# Patient Record
Sex: Female | Born: 1975
Health system: Southern US, Community
[De-identification: ages and names within clinical notes are randomized; demographics above are authoritative.]

## PROBLEM LIST (undated history)

## (undated) DIAGNOSIS — L309 Dermatitis, unspecified: Secondary | ICD-10-CM

## (undated) DIAGNOSIS — I10 Essential (primary) hypertension: Secondary | ICD-10-CM

---

## 2010-08-19 ENCOUNTER — Other Ambulatory Visit: Payer: Self-pay | Admitting: Family Medicine

## 2010-08-19 DIAGNOSIS — R1032 Left lower quadrant pain: Secondary | ICD-10-CM

## 2010-08-20 ENCOUNTER — Other Ambulatory Visit: Payer: Self-pay

## 2010-08-23 ENCOUNTER — Ambulatory Visit
Admission: RE | Admit: 2010-08-23 | Discharge: 2010-08-23 | Disposition: A | Discharge status: Home / Self Care | Payer: Medicaid Other | Source: Ambulatory Visit | Attending: Family Medicine | Admitting: Family Medicine

## 2010-08-23 ENCOUNTER — Ambulatory Visit
Admission: RE | Admit: 2010-08-23 | Discharge: 2010-08-23 | Disposition: A | Discharge status: Home / Self Care | Payer: Self-pay | Source: Ambulatory Visit | Attending: Family Medicine | Admitting: Family Medicine

## 2010-08-23 ENCOUNTER — Other Ambulatory Visit: Payer: Self-pay | Admitting: Family Medicine

## 2010-08-23 DIAGNOSIS — R1032 Left lower quadrant pain: Secondary | ICD-10-CM

## 2010-08-24 ENCOUNTER — Other Ambulatory Visit: Payer: Self-pay

## 2016-03-05 ENCOUNTER — Emergency Department (HOSPITAL_BASED_OUTPATIENT_CLINIC_OR_DEPARTMENT_OTHER)
Admission: EM | Admit: 2016-03-05 | Discharge: 2016-03-05 | Disposition: A | Discharge status: Home / Self Care | Payer: Medicaid Other | Attending: Emergency Medicine | Admitting: Emergency Medicine

## 2016-03-05 ENCOUNTER — Encounter (HOSPITAL_BASED_OUTPATIENT_CLINIC_OR_DEPARTMENT_OTHER): Payer: Self-pay | Admitting: Emergency Medicine

## 2016-03-05 ENCOUNTER — Emergency Department (HOSPITAL_BASED_OUTPATIENT_CLINIC_OR_DEPARTMENT_OTHER): Payer: Medicaid Other

## 2016-03-05 DIAGNOSIS — N83202 Unspecified ovarian cyst, left side: Secondary | ICD-10-CM | POA: Insufficient documentation

## 2016-03-05 DIAGNOSIS — L309 Dermatitis, unspecified: Secondary | ICD-10-CM

## 2016-03-05 DIAGNOSIS — A599 Trichomoniasis, unspecified: Secondary | ICD-10-CM | POA: Insufficient documentation

## 2016-03-05 DIAGNOSIS — R110 Nausea: Secondary | ICD-10-CM | POA: Insufficient documentation

## 2016-03-05 DIAGNOSIS — I10 Essential (primary) hypertension: Secondary | ICD-10-CM | POA: Insufficient documentation

## 2016-03-05 DIAGNOSIS — F17200 Nicotine dependence, unspecified, uncomplicated: Secondary | ICD-10-CM | POA: Insufficient documentation

## 2016-03-05 DIAGNOSIS — R102 Pelvic and perineal pain: Secondary | ICD-10-CM

## 2016-03-05 DIAGNOSIS — R3915 Urgency of urination: Secondary | ICD-10-CM | POA: Insufficient documentation

## 2016-03-05 HISTORY — DX: Essential (primary) hypertension: I10

## 2016-03-05 LAB — URINALYSIS, ROUTINE W REFLEX MICROSCOPIC
Bilirubin Urine: NEGATIVE
GLUCOSE, UA: NEGATIVE mg/dL
Ketones, ur: NEGATIVE mg/dL
Nitrite: NEGATIVE
PH: 7.5 (ref 5.0–8.0)
PROTEIN: NEGATIVE mg/dL
Specific Gravity, Urine: 1.013 (ref 1.005–1.030)

## 2016-03-05 LAB — CBC WITH DIFFERENTIAL/PLATELET
BASOS PCT: 1 %
Basophils Absolute: 0.1 10*3/uL (ref 0.0–0.1)
EOS PCT: 7 %
Eosinophils Absolute: 0.6 10*3/uL (ref 0.0–0.7)
HCT: 38.8 % (ref 36.0–46.0)
Hemoglobin: 13.8 g/dL (ref 12.0–15.0)
Lymphocytes Relative: 28 %
Lymphs Abs: 2.5 10*3/uL (ref 0.7–4.0)
MCH: 30 pg (ref 26.0–34.0)
MCHC: 35.6 g/dL (ref 30.0–36.0)
MCV: 84.3 fL (ref 78.0–100.0)
MONO ABS: 0.7 10*3/uL (ref 0.1–1.0)
MONOS PCT: 7 %
Neutro Abs: 5.2 10*3/uL (ref 1.7–7.7)
Neutrophils Relative %: 57 %
PLATELETS: 293 10*3/uL (ref 150–400)
RBC: 4.6 MIL/uL (ref 3.87–5.11)
RDW: 13.2 % (ref 11.5–15.5)
WBC: 9.1 10*3/uL (ref 4.0–10.5)

## 2016-03-05 LAB — WET PREP, GENITAL
SPERM: NONE SEEN
YEAST WET PREP: NONE SEEN

## 2016-03-05 LAB — URINE MICROSCOPIC-ADD ON

## 2016-03-05 LAB — PREGNANCY, URINE: Preg Test, Ur: NEGATIVE

## 2016-03-05 MED ORDER — TRAMADOL HCL 50 MG PO TABS: 50.0000 mg | ORAL_TABLET | Freq: Four times a day (QID) | ORAL | 0 refills | Status: DC | PRN

## 2016-03-05 MED ORDER — KETOROLAC TROMETHAMINE 60 MG/2ML IM SOLN
60.0000 mg | Freq: Once | INTRAMUSCULAR | Status: AC
  Administered 2016-03-05: 60 mg via INTRAMUSCULAR
  Filled 2016-03-05: qty 2

## 2016-03-05 MED ORDER — METRONIDAZOLE 500 MG PO TABS: 500.0000 mg | ORAL_TABLET | Freq: Two times a day (BID) | ORAL | 0 refills | Status: DC

## 2016-03-05 MED ORDER — TRIAMCINOLONE ACETONIDE 0.1 % EX CREA: 1.0000 "application " | TOPICAL_CREAM | Freq: Two times a day (BID) | CUTANEOUS | 0 refills | Status: DC

## 2016-03-05 MED ORDER — NAPROXEN 500 MG PO TABS: 500.0000 mg | ORAL_TABLET | Freq: Two times a day (BID) | ORAL | 0 refills | Status: DC

## 2016-03-05 NOTE — ED Triage Notes (Signed)
Patient states that she has had a left pelvic pressure and groing pain x 1 week. The patient also reports that she is getting a generalized rash

## 2016-03-05 NOTE — ED Provider Notes (Signed)
[facility] Provider Note   CSN: [id number] Arrival date & time: 03/05/16  1549  By signing my name below, I, Ephriam Jenkins, attest that this documentation has been prepared under the direction and in the presence of [facility] NP.  Electronically Signed: Ephriam Jenkins, ED Scribe. 03/05/16. 4:43 PM.   History   Chief Complaint Chief Complaint  Patient presents with  . Pelvic Pain    HPI HPI Comments: Christina Mayer is a 40 y.o. female who presents to the Emergency Department complaining of 8/10 sharp pelvic pain that started six days ago. Pt reports worst pain to the left side of her lower abdomen. Pt has taken aleve with no significant relief.  Pt does not take birth control and states she is currently sexually active. Pt reports increased urinary urgency and frequency since the onset of symptoms. Denies dysuria. Pt further reports mild nausea. Pt's last pap smear February 2017 at health department in [facility]; states that her results were normal. Pt is G2P1A0; one full term stillborn. Pt also complains of dry "rashes" to her bilateral forearms and legs. Pt has applied hydrocortisone with no relief. Pt denies any Hx of abdominal surgeries, ovarian cysts, PID. She denies fever, chills, back pain, constipation, diarrhea, cold symptoms.    The history is provided by the patient. No language interpreter was used.   Past Medical History:  Diagnosis Date  . Hypertension     There are no active problems to display for this patient.   History reviewed. No pertinent surgical history.  OB History    No data available       Home Medications    Prior to Admission medications   Medication Sig Start Date End Date Taking? Authorizing Provider  metroNIDAZOLE (FLAGYL) 500 MG tablet Take 1 tablet (500 mg total) by mouth 2 (two) times daily. 03/05/16   Deron Poole Bunnie Pion, NP  naproxen (NAPROSYN) 500 MG tablet Take 1 tablet (500 mg total) by mouth 2 (two) times daily. 03/05/16   Jehad Bisono Bunnie Pion, NP  traMADol (ULTRAM) 50 MG tablet Take 1 tablet (50 mg total) by mouth every 6 (six) hours as needed. 03/05/16   Scotland Korver Bunnie Pion, NP    Family History History reviewed. No pertinent family history.  Social History Social History  Substance Use Topics  . Smoking status: Current Some Day Smoker  . Smokeless tobacco: Never Used  . Alcohol use Yes     Comment: occ     Allergies   Review of patient's allergies indicates no known allergies.   Review of Systems Review of Systems  Constitutional: Negative for chills and fever.  Gastrointestinal: Positive for abdominal pain (lower) and nausea. Negative for constipation and diarrhea.  Genitourinary: Positive for frequency and urgency.  Musculoskeletal: Negative for back pain.  All other systems reviewed and are negative.    Physical Exam Updated Vital Signs BP 160/91 (BP Location: Right Arm)   Pulse (!) 58   Temp 98.3 F (36.8 C) (Oral)   Resp 12   Ht 5\' 6"  (1.676 m)   Wt 85.2 kg   LMP 02/16/2016 (Approximate)   SpO2 100%   BMI 30.33 kg/m   Physical Exam  Constitutional: She is oriented to person, place, and time. She appears well-developed and well-nourished. No distress.     HENT:  Head: Normocephalic and atraumatic.  Neck: Normal range of motion.  Cardiovascular: Normal rate.   Pulmonary/Chest: Effort normal and breath sounds normal.  Abdominal: Soft. Bowel sounds  are normal. There is tenderness.  LLQ and Suprapubic tenderness on palpation  Genitourinary:  Genitourinary Comments: External genitalia without lesions, scant blood vaginal vault, no CMT, left adnexal tenderness, uterus without palpable enlargement.   Musculoskeletal: She exhibits no tenderness.  No CVA tenderness  Neurological: She is alert and oriented to person, place, and time.  Skin: Skin is warm and dry. She is not diaphoretic.   Large dry patches of skin to the forearms. And small area to the posterior aspects of the thighs   Psychiatric:  She has a normal mood and affect. Her behavior is normal.  Nursing note and vitals reviewed.    ED Treatments / Results  DIAGNOSTIC STUDIES: Oxygen Saturation is 100% on RA, normal by my interpretation.  COORDINATION OF CARE: 4:25 PM-Will do pelvic exam. Discussed treatment plan with pt at bedside and pt agreed to plan.   Labs (all labs ordered are listed, but only abnormal results are displayed) Labs Reviewed  WET PREP, GENITAL - Abnormal; Notable for the following:       Result Value   Trich, Wet Prep PRESENT (*)    Clue Cells Wet Prep HPF POC PRESENT (*)    WBC, Wet Prep HPF POC MODERATE (*)    All other components within normal limits  URINALYSIS, ROUTINE W REFLEX MICROSCOPIC (NOT AT [facility]) - Abnormal; Notable for the following:    Hgb urine dipstick TRACE (*)    Leukocytes, UA SMALL (*)    All other components within normal limits  URINE MICROSCOPIC-ADD ON - Abnormal; Notable for the following:    Squamous Epithelial / LPF 0-5 (*)    Bacteria, UA FEW (*)    All other components within normal limits  PREGNANCY, URINE  CBC WITH DIFFERENTIAL/PLATELET  RPR  HIV ANTIBODY (ROUTINE TESTING)  GC/CHLAMYDIA PROBE AMP ([place]) NOT AT [facility]    Radiology US Transvaginal Non-ob  Result Date: 03/05/2016 CLINICAL DATA:  Left lower quadrant pelvic pain for 5 days EXAM: TRANSABDOMINAL AND TRANSVAGINAL ULTRASOUND OF PELVIS TECHNIQUE: Both transabdominal and transvaginal ultrasound examinations of the pelvis were performed. Transabdominal technique was performed for global imaging of the pelvis including uterus, ovaries, adnexal regions, and pelvic cul-de-sac. It was necessary to proceed with endovaginal exam following the transabdominal exam to visualize the pelvic structures. COMPARISON:  None FINDINGS: Uterus Measurements: 9.1 x 4.7 x 5.2 cm. Diffuse heterogeneity is noted which may be related to leiomyomatous involvement although no definitive fibroid is seen. Endometrium Thickness:  8.5 mm. Tiny amount of fluid is noted endometrial cavity of uncertain significance. Right ovary Measurements: 3.2 x 1.9 x 1.7 cm. Normal appearance/no adnexal mass. Left ovary Measurements: 4.4 x 2.4 x 2.9 cm. Complex relatively hyperechoic areas noted within the left ovary measuring 2.5 x 1.6 x 2.2 cm. This may represent a hemorrhagic cyst. Other findings Mild free fluid is noted. IMPRESSION: Complex area in the left ovary as described which may represent a hemorrhagic cyst. Followup in 6-8 weeks is recommended as clinically indicated. Electronically Signed   By: Inez Catalina M.D.   On: 03/05/2016 17:45   US Pelvis Complete  Result Date: 03/05/2016 CLINICAL DATA:  Left lower quadrant pelvic pain for 5 days EXAM: TRANSABDOMINAL AND TRANSVAGINAL ULTRASOUND OF PELVIS TECHNIQUE: Both transabdominal and transvaginal ultrasound examinations of the pelvis were performed. Transabdominal technique was performed for global imaging of the pelvis including uterus, ovaries, adnexal regions, and pelvic cul-de-sac. It was necessary to proceed with endovaginal exam following the transabdominal exam to visualize the  pelvic structures. COMPARISON:  None FINDINGS: Uterus Measurements: 9.1 x 4.7 x 5.2 cm. Diffuse heterogeneity is noted which may be related to leiomyomatous involvement although no definitive fibroid is seen. Endometrium Thickness: 8.5 mm. Tiny amount of fluid is noted endometrial cavity of uncertain significance. Right ovary Measurements: 3.2 x 1.9 x 1.7 cm. Normal appearance/no adnexal mass. Left ovary Measurements: 4.4 x 2.4 x 2.9 cm. Complex relatively hyperechoic areas noted within the left ovary measuring 2.5 x 1.6 x 2.2 cm. This may represent a hemorrhagic cyst. Other findings Mild free fluid is noted. IMPRESSION: Complex area in the left ovary as described which may represent a hemorrhagic cyst. Followup in 6-8 weeks is recommended as clinically indicated. Electronically Signed   By: Inez Catalina M.D.   On:  03/05/2016 17:45    Procedures Procedures (including critical care time)  Medications Ordered in ED Medications  ketorolac (TORADOL) injection 60 mg (60 mg Intramuscular Given 03/05/16 1856)     Initial Impression / Assessment and Plan / ED Course  I have reviewed the triage vital signs and the nursing notes.  Pertinent labs & imaging results that were available during my care of the patient were reviewed by me and considered in my medical decision making (see chart for details).  Clinical Course   Toradol IM for pain  Ultrasound, labs  Final Clinical Impressions(s) / ED Diagnoses  40 y.o. female with LLQ abdominal pain stable for d/c with pain improved after Toradol. Will treat for trichomonas and patient to notify her sex partners.  She will f/u with the health department.   Final diagnoses:  Pelvic pain in female  Left ovarian cyst  Trichomonas infection    New Prescriptions New Prescriptions   METRONIDAZOLE (FLAGYL) 500 MG TABLET    Take 1 tablet (500 mg total) by mouth 2 (two) times daily.   NAPROXEN (NAPROSYN) 500 MG TABLET    Take 1 tablet (500 mg total) by mouth 2 (two) times daily.   TRAMADOL (ULTRAM) 50 MG TABLET    Take 1 tablet (50 mg total) by mouth every 6 (six) hours as needed.  I personally performed the services described in this documentation, which was scribed in my presence. The recorded information has been reviewed and is accurate.     [address] [place], NP 03/05/16 Dorthula Perfect    Leonard Schwartz, MD 03/06/16 [phone]

## 2016-03-05 NOTE — Discharge Instructions (Signed)
Follow up with your GYN to have a follow up ultrasound in 6 to 8 weeks to be sure the cyst has resolved. Return sooner for any problems.  Be sure your sex partner is treated for trichomonas infection since men often don't have symptoms and can re infect you.

## 2016-03-07 LAB — HIV ANTIBODY (ROUTINE TESTING W REFLEX): HIV Screen 4th Generation wRfx: NONREACTIVE

## 2016-03-07 LAB — GC/CHLAMYDIA PROBE AMP (~~LOC~~) NOT AT ARMC
Chlamydia: NEGATIVE
Neisseria Gonorrhea: NEGATIVE

## 2016-03-07 LAB — RPR: RPR: NONREACTIVE

## 2016-10-27 ENCOUNTER — Emergency Department (HOSPITAL_BASED_OUTPATIENT_CLINIC_OR_DEPARTMENT_OTHER)
Admission: EM | Admit: 2016-10-27 | Discharge: 2016-10-27 | Disposition: A | Discharge status: Home / Self Care | Payer: Medicaid Other | Attending: Emergency Medicine | Admitting: Emergency Medicine

## 2016-10-27 ENCOUNTER — Encounter (HOSPITAL_BASED_OUTPATIENT_CLINIC_OR_DEPARTMENT_OTHER): Payer: Self-pay

## 2016-10-27 DIAGNOSIS — I10 Essential (primary) hypertension: Secondary | ICD-10-CM | POA: Insufficient documentation

## 2016-10-27 DIAGNOSIS — F17200 Nicotine dependence, unspecified, uncomplicated: Secondary | ICD-10-CM | POA: Insufficient documentation

## 2016-10-27 DIAGNOSIS — L258 Unspecified contact dermatitis due to other agents: Secondary | ICD-10-CM | POA: Insufficient documentation

## 2016-10-27 HISTORY — DX: Dermatitis, unspecified: L30.9

## 2016-10-27 MED ORDER — PREDNISONE 10 MG PO TABS: 40.0000 mg | ORAL_TABLET | Freq: Every day | ORAL | 0 refills | Status: AC

## 2016-10-27 MED ORDER — TRIAMCINOLONE ACETONIDE 0.1 % EX CREA: 1.0000 "application " | TOPICAL_CREAM | Freq: Two times a day (BID) | CUTANEOUS | 0 refills | Status: DC

## 2016-10-27 MED ORDER — PREDNISONE 50 MG PO TABS: 60.0000 mg | ORAL_TABLET | Freq: Once | ORAL | Status: DC

## 2016-10-27 MED ORDER — DIPHENHYDRAMINE HCL 25 MG PO CAPS: 50.0000 mg | ORAL_CAPSULE | Freq: Once | ORAL | Status: DC

## 2016-10-27 MED ORDER — PREDNISONE 50 MG PO TABS
60.0000 mg | ORAL_TABLET | Freq: Once | ORAL | Status: AC
  Administered 2016-10-27: 60 mg via ORAL
  Filled 2016-10-27: qty 1

## 2016-10-27 MED FILL — predniSONE 10 MG TABS: 10 | 4 days supply | Qty: 16 | Fill #0

## 2016-10-27 MED FILL — TRIAMCINOLONE 0.1% CREAM: 0.1 | 7 days supply | Qty: 15 | Fill #0

## 2016-10-27 NOTE — ED Notes (Signed)
Pt directed to pharmacy to pick up Rx. Work note given

## 2016-10-27 NOTE — ED Triage Notes (Signed)
c/o rash x 4 days-NAD-steady gait

## 2016-10-27 NOTE — Discharge Instructions (Signed)
Keep your skin covered while at work. Start taking the prednisone tomorrow for the next 4 days. Benadryl at nighttime. Follow up with your primary care provider. Return to the emergency department if your rash worsens, you have a fever, chills, or any other new concerning symptoms.

## 2016-10-27 NOTE — ED Provider Notes (Signed)
[place] [facility] Provider Note   CSN: [id number] Arrival date & time: 10/27/16  1129     History   Chief Complaint Chief Complaint  Patient presents with  . Rash    HPI Christina Mayer is a 41 y.o. female presenting with 4 days of a papular rash that started on the dorsum of her hands and is now spreading up her arms, neck and along her hairline. She reports pruritus on her hands but burning on her neck and hairline. This is different from her eczema which is on the posterior elbows. She denies any rash on her ankles or history of outdoor activities. 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 She denies headache, fever, chills, nausea, vomiting, or any other symptoms.  HPI  Past Medical History:  Diagnosis Date  . Eczema   . Hypertension     There are no active problems to display for this patient.   History reviewed. No pertinent surgical history.  OB History    No data available       Home Medications    Prior to Admission medications   Medication Sig Start Date End Date Taking? Authorizing Provider  predniSONE (DELTASONE) 10 MG tablet Take 4 tablets (40 mg total) by mouth daily. 10/27/16 10/31/16  Emeline General, PA-C  triamcinolone cream (KENALOG) 0.1 % Apply 1 application topically 2 (two) times daily. 10/27/16   Emeline General, PA-C    Family History No family history on file.  Social History Social History  Substance Use Topics  . Smoking status: Current Every Day Smoker  . Smokeless tobacco: Never Used  . Alcohol use Yes     Comment: occ     Allergies   Patient has no known allergies.   Review of Systems Review of Systems  Constitutional: Negative for chills and fever.  HENT: Negative for ear pain, facial swelling, mouth sores and sore throat.   Respiratory: Negative for cough, shortness of breath, wheezing and stridor.     Cardiovascular: Negative for chest pain, palpitations and leg swelling.  Gastrointestinal: Negative for abdominal distention, abdominal pain, diarrhea, nausea and vomiting.  Musculoskeletal: Negative for arthralgias, back pain, myalgias, neck pain and neck stiffness.  Skin: Positive for color change and rash. Negative for pallor and wound.  Neurological: Negative for dizziness, seizures, syncope, weakness and headaches.     Physical Exam Updated Vital Signs BP 137/74 (BP Location: Left Arm)   Pulse (!) 58   Temp 98.5 F (36.9 C) (Oral)   Resp 18   Ht 5' 6.5" (1.689 m)   Wt 81.6 kg   LMP 10/26/2016   SpO2 100%   BMI 28.62 kg/m   Physical Exam  Constitutional: She appears well-developed and well-nourished. No distress.  Patient is afebrile, nontoxic-appearing, sitting comfortably in chair in no acute distress.  HENT:  Head: Normocephalic and atraumatic.  Eyes: Conjunctivae and EOM are normal. Right eye exhibits no discharge. Left eye exhibits no discharge.  Neck: Normal range of motion. Neck supple.  Cardiovascular: Normal rate, regular rhythm and normal heart sounds.   No murmur heard. Pulmonary/Chest: Effort normal and breath sounds normal. No stridor. No respiratory distress. She has no wheezes. She has no rales. She exhibits no tenderness.  Abdominal: She exhibits no distension. There is no tenderness.  Musculoskeletal: She exhibits no edema or deformity.  Lymphadenopathy:  She has no cervical adenopathy.  Neurological: She is alert.  Skin: Skin is warm and dry. Rash noted. She is not diaphoretic. There is erythema. No pallor.  Psychiatric: She has a normal mood and affect.  Nursing note and vitals reviewed.    ED Treatments / Results  Labs (all labs ordered are listed, but only abnormal results are displayed) Labs Reviewed - No data to display  EKG  EKG Interpretation None       Radiology No results found.  Procedures Procedures (including critical  care time)  Medications Ordered in ED Medications  predniSONE (DELTASONE) tablet 60 mg (60 mg Oral Given 10/27/16 1457)     Initial Impression / Assessment and Plan / ED Course  I have reviewed the triage vital signs and the nursing notes.  Pertinent labs & imaging results that were available during my care of the patient were reviewed by me and considered in my medical decision making (see chart for details).     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 She does have a history of eczema but this is a little bit different.   Presentation and skin findings consistent with contact dermatitis. Patient was given prednisone and offered Benadryl while in the ED. She refused Benadryl because she has to drive home, but she will be taking it when she gets home.  She was stable, nontoxic and in no distress. Exam otherwise unremarkable, lungs are clear and equal bilaterally, no difficulty breathing or swelling of the throat. Tolerating oral secretions well. Discharge home with steroid burst/cream and Benadryl. Advised patient to follow up with primary care provider.  Discussed strict return precautions and advised to return to the emergency department if experiencing any new or worsening symptoms. Instructions were understood and patient agreed with discharge plan.  Final Clinical Impressions(s) / ED Diagnoses   Final diagnoses:  Contact dermatitis due to other agent, unspecified contact dermatitis type    New Prescriptions Discharge Medication List as of 10/27/2016  2:29 PM    START taking these medications   Details  predniSONE (DELTASONE) 10 MG tablet Take 4 tablets (40 mg total) by mouth daily., Starting Thu 10/27/2016, Until Mon 10/31/2016, Print    triamcinolone cream (KENALOG) 0.1 % Apply 1 application topically 2 (two) times daily., Starting Thu 10/27/2016, Print         Emeline General,  PA-C 10/27/16 [place], MD 10/28/16 [phone]

## 2016-12-08 ENCOUNTER — Emergency Department (HOSPITAL_BASED_OUTPATIENT_CLINIC_OR_DEPARTMENT_OTHER)
Admission: EM | Admit: 2016-12-08 | Discharge: 2016-12-08 | Disposition: A | Discharge status: Home / Self Care | Payer: Medicaid Other | Attending: Emergency Medicine | Admitting: Emergency Medicine

## 2016-12-08 ENCOUNTER — Encounter (HOSPITAL_BASED_OUTPATIENT_CLINIC_OR_DEPARTMENT_OTHER): Payer: Self-pay

## 2016-12-08 DIAGNOSIS — I10 Essential (primary) hypertension: Secondary | ICD-10-CM | POA: Insufficient documentation

## 2016-12-08 DIAGNOSIS — F17200 Nicotine dependence, unspecified, uncomplicated: Secondary | ICD-10-CM | POA: Insufficient documentation

## 2016-12-08 DIAGNOSIS — L309 Dermatitis, unspecified: Secondary | ICD-10-CM

## 2016-12-08 DIAGNOSIS — L259 Unspecified contact dermatitis, unspecified cause: Secondary | ICD-10-CM | POA: Insufficient documentation

## 2016-12-08 MED ORDER — CEPHALEXIN 500 MG PO CAPS: 500.0000 mg | ORAL_CAPSULE | Freq: Four times a day (QID) | ORAL | 0 refills | Status: DC

## 2016-12-08 MED ORDER — TRIAMCINOLONE ACETONIDE 0.1 % EX CREA: 1.0000 "application " | TOPICAL_CREAM | Freq: Two times a day (BID) | CUTANEOUS | 0 refills | Status: AC

## 2016-12-08 MED ORDER — PREDNISONE 10 MG (21) PO TBPK: ORAL_TABLET | Freq: Every day | ORAL | 0 refills | Status: DC

## 2016-12-08 MED ORDER — PREDNISONE 50 MG PO TABS
60.0000 mg | ORAL_TABLET | Freq: Once | ORAL | Status: AC
  Administered 2016-12-08: 60 mg via ORAL
  Filled 2016-12-08: qty 1

## 2016-12-08 MED FILL — TRIAMCINOLONE 0.1% CREAM: 0.1 | 30 days supply | Qty: 30 | Fill #0

## 2016-12-08 MED FILL — CEPHALEXIN 500 MG CAPSULE: 500 | 7 days supply | Qty: 28 | Fill #0

## 2016-12-08 MED FILL — predniSONE 10 MG TABS: 10 | 12 days supply | Qty: 42 | Fill #0

## 2016-12-08 NOTE — Discharge Instructions (Signed)
Referral to dermatology provided. Please call for follow-up. Take steroids as prescribed. Antibiotics prescribed for possible secondary skin infection over the forearms.  Return without fail for worsening symptoms, including fever or any other symptoms concerning to you.

## 2016-12-08 NOTE — ED Triage Notes (Signed)
C/o scattered rash x 2+ months-states she was seen here for same 2 months ago-NAD-steady gait

## 2016-12-08 NOTE — ED Provider Notes (Signed)
[place] [facility] Provider Note   CSN: [id number] Arrival date & time: 12/08/16  1311     History   Chief Complaint Chief Complaint  Patient presents with  . Rash    HPI Christina Mayer is a 41 y.o. female.  The history is provided by the patient.  Rash   This is a new problem. The current episode started more than 1 week ago. The problem has been gradually worsening. The problem is associated with nothing. There has been no fever. The rash is present on the left arm, right lower leg, right upper leg, left lower leg, left upper leg and right arm. The pain is moderate. The pain has been constant since onset. Associated symptoms include itching, pain and weeping. She has tried steriods for the symptoms. The treatment provided significant relief.   41 year old female who presents with rash. She has a history of eczema. Was seen in the ED 2 months ago for macular papular pruritic rash, which she states went away with steroids and steroid cream. States that shortly afterwards she had return of her rash. It was initially thought it may have been due to new work exposure, but she has changed jobs and still has this rash. No new medications, lotions, creams, detergents, or foods. No new medications. No mouth, throat, eye involvement. No fevers or chills, nausea or vomiting, or any other symptoms.  Past Medical History:  Diagnosis Date  . Eczema   . Hypertension     There are no active problems to display for this patient.   History reviewed. No pertinent surgical history.  OB History    No data available       Home Medications    Prior to Admission medications   Not on File    Family History No family history on file.  Social History Social History  Substance Use Topics  . Smoking status: Current Every Day Smoker  . Smokeless tobacco: Never Used  . Alcohol use Yes     Comment: occ     Allergies   Patient has no known allergies.   Review of Systems Review  of Systems  Constitutional: Negative for fever.  HENT: Negative for sore throat.   Eyes: Negative for redness.  Respiratory: Negative for shortness of breath.   Cardiovascular: Negative for chest pain.  Gastrointestinal: Negative for abdominal pain.  Skin: Positive for itching and rash.  Allergic/Immunologic: Negative for immunocompromised state.  Hematological: Does not bruise/bleed easily.  All other systems reviewed and are negative.    Physical Exam Updated Vital Signs BP (!) 160/97 (BP Location: Left Arm)   Pulse 80   Temp 99.8 F (37.7 C) (Oral)   Resp 16   Ht 5\' 6"  (1.676 m)   Wt 89.8 kg (198 lb)   LMP 11/20/2016   SpO2 99%   BMI 31.96 kg/m   Physical Exam Physical Exam  Constitutional: He appears well-developed and well-nourished.  HENT:  Head: Normocephalic.  Eyes: Conjunctivae are normal.  Cardiovascular: Normal rate and intact distal pulses.   Pulmonary/Chest: Effort normal. No respiratory distress.  Abdominal: He exhibits no distension.  Musculoskeletal: Normal range of motion. He exhibits no deformity.  Neurological: He is alert.  Skin: Skin is warm and dry. maculopapular rash over extensor surface of the bilateral lower legs and arms, area of confluent scaly skin with open scabs over bilateral forearms, no overlying warmth but mild erythema and induration over scaly areas of forearm Psychiatric: He has a normal mood and  affect. His behavior is normal.  Nursing note and vitals reviewed.    ED Treatments / Results  Labs (all labs ordered are listed, but only abnormal results are displayed) Labs Reviewed - No data to display  EKG  EKG Interpretation None       Radiology No results found.  Procedures Procedures (including critical care time)  Medications Ordered in ED Medications - No data to display   Initial Impression / Assessment and Plan / ED Course  I have reviewed the triage vital signs and the nursing notes.  Pertinent labs &  imaging results that were available during my care of the patient were reviewed by me and considered in my medical decision making (see chart for details).     Presenting with recurrent rash. She is nontoxic in no acute distress. Afebrile without systemic signs of illness. She has scattered macular papular rash over the extensor surfaces of arms and legs. No mucus capsule involvement. With more scaly and scabbed over areas that is confluent over the forearms of the extensor surfaces of the arm. Suspect allergic dermatitis versus eczema related rash. Question if there may be some overlying cellulitis of the forearm. I will restart prednisone, steroid cream, and also given a course of Keflex. Dermatology referral also provided. Strict return and follow-up instructions reviewed. She expressed understanding of all discharge instructions and felt comfortable with the plan of care.   Final Clinical Impressions(s) / ED Diagnoses   Final diagnoses:  Dermatitis    New Prescriptions New Prescriptions   No medications on file     Forde Dandy, MD 12/08/16 1335

## 2016-12-08 NOTE — ED Notes (Signed)
Patient stated that her BUE is itching and burning; BLE is itching.  I was not able to visualize her leg because she had her pants on.

## 2017-08-08 ENCOUNTER — Encounter (HOSPITAL_BASED_OUTPATIENT_CLINIC_OR_DEPARTMENT_OTHER): Payer: Self-pay | Admitting: Emergency Medicine

## 2017-08-08 ENCOUNTER — Other Ambulatory Visit: Payer: Self-pay

## 2017-08-08 ENCOUNTER — Emergency Department (HOSPITAL_BASED_OUTPATIENT_CLINIC_OR_DEPARTMENT_OTHER)
Admission: EM | Admit: 2017-08-08 | Discharge: 2017-08-08 | Disposition: A | Discharge status: Home / Self Care | Payer: BLUE CROSS/BLUE SHIELD | Attending: Emergency Medicine | Admitting: Emergency Medicine

## 2017-08-08 DIAGNOSIS — Z23 Encounter for immunization: Secondary | ICD-10-CM | POA: Insufficient documentation

## 2017-08-08 DIAGNOSIS — W228XXA Striking against or struck by other objects, initial encounter: Secondary | ICD-10-CM | POA: Insufficient documentation

## 2017-08-08 DIAGNOSIS — I10 Essential (primary) hypertension: Secondary | ICD-10-CM | POA: Insufficient documentation

## 2017-08-08 DIAGNOSIS — Y998 Other external cause status: Secondary | ICD-10-CM | POA: Diagnosis not present

## 2017-08-08 DIAGNOSIS — F17200 Nicotine dependence, unspecified, uncomplicated: Secondary | ICD-10-CM | POA: Diagnosis not present

## 2017-08-08 DIAGNOSIS — S0093XA Contusion of unspecified part of head, initial encounter: Secondary | ICD-10-CM | POA: Insufficient documentation

## 2017-08-08 DIAGNOSIS — Y9389 Activity, other specified: Secondary | ICD-10-CM | POA: Diagnosis not present

## 2017-08-08 DIAGNOSIS — Y929 Unspecified place or not applicable: Secondary | ICD-10-CM | POA: Insufficient documentation

## 2017-08-08 DIAGNOSIS — S0990XA Unspecified injury of head, initial encounter: Secondary | ICD-10-CM | POA: Diagnosis present

## 2017-08-08 MED ORDER — IBUPROFEN 800 MG PO TABS
800.0000 mg | ORAL_TABLET | Freq: Once | ORAL | Status: AC
  Administered 2017-08-08: 800 mg via ORAL
  Filled 2017-08-08: qty 1

## 2017-08-08 MED ORDER — TETANUS-DIPHTH-ACELL PERTUSSIS 5-2.5-18.5 LF-MCG/0.5 IM SUSP
0.5000 mL | Freq: Once | INTRAMUSCULAR | Status: AC
  Administered 2017-08-08: 0.5 mL via INTRAMUSCULAR
  Filled 2017-08-08: qty 0.5

## 2017-08-08 MED ORDER — IBUPROFEN 800 MG PO TABS: 800.0000 mg | ORAL_TABLET | Freq: Three times a day (TID) | ORAL | 0 refills | Status: DC | PRN

## 2017-08-08 NOTE — ED Triage Notes (Signed)
Pt reports hitting her head on the corner of her car door last night. Swelling noted above L eye with small abrasion.

## 2017-08-08 NOTE — ED Notes (Signed)
ED Provider at bedside. 

## 2017-08-08 NOTE — ED Provider Notes (Signed)
[facility] EMERGENCY DEPARTMENT Provider Note   CSN: [id number] Arrival date & time: 08/08/17  0846     History   Chief Complaint Chief Complaint  Patient presents with  . Head Injury    HPI Christina Mayer is a 42 y.o. female.  The history is provided by the patient.  Head Injury   The incident occurred yesterday. The injury mechanism was a direct blow. There was no loss of consciousness. There was no blood loss. The quality of the pain is described as throbbing. The pain is severe. The pain has been constant since the injury. Associated symptoms include blurred vision. Pertinent negatives include no numbness, no vomiting, no tinnitus, no disorientation, no weakness and no memory loss. She has tried nothing for the symptoms. The treatment provided no relief.   42 year old female complaining of head injury last evening.  She states she dropped her keys after exiting her car when she leaned over to get them she hit her head on the corner of the door.  There was no loss of consciousness.  She did not think much of it until this morning when she was experiencing severe pain over the area.  She has some blurry vision.  There is a small abrasion over the area that she applied peroxide to and then some Neosporin.  While at work today she was evaluated by the nurse there who thought she should come to urgent care for evaluation for butterfly stitch for her abrasion. She does not recall her last tetanus shot.  There is associated pain over the area but no nausea vomiting or alteration of consciousness.  Past Medical History:  Diagnosis Date  . Eczema   . Hypertension     There are no active problems to display for this patient.   History reviewed. No pertinent surgical history.  OB History    No data available       Home Medications    Prior to Admission medications   Medication Sig Start Date End Date Taking? Authorizing Provider  cephALEXin (KEFLEX) 500 MG capsule  Take 1 capsule (500 mg total) by mouth 4 (four) times daily. 12/08/16   Forde Dandy, MD  ibuprofen (ADVIL,MOTRIN) 800 MG tablet Take 1 tablet (800 mg total) by mouth every 8 (eight) hours as needed for moderate pain. 08/08/17   Hayden Rasmussen, MD  predniSONE (STERAPRED UNI-PAK 21 TAB) 10 MG (21) TBPK tablet Take by mouth daily. Take 6 tabs by mouth daily  for 2 days, then 5 tabs for 2 days, then 4 tabs for 2 days, then 3 tabs for 2 days, 2 tabs for 2 days, then 1 tab by mouth daily for 2 days 12/08/16   Forde Dandy, MD  triamcinolone cream (KENALOG) 0.1 % Apply 1 application topically 2 (two) times daily. 12/08/16   Forde Dandy, MD    Family History No family history on file.  Social History Social History   Tobacco Use  . Smoking status: Current Every Day Smoker  . Smokeless tobacco: Never Used  Substance Use Topics  . Alcohol use: Yes    Comment: occ  . Drug use: No     Allergies   Patient has no known allergies.   Review of Systems Review of Systems  Constitutional: Negative for fever.  HENT: Negative for ear pain, sore throat and tinnitus.   Eyes: Positive for blurred vision.  Respiratory: Negative for shortness of breath.   Cardiovascular: Negative for chest pain.  Gastrointestinal: Negative for abdominal pain and vomiting.  Genitourinary: Negative for dysuria.  Skin: Negative for rash.  Neurological: Positive for headaches. Negative for speech difficulty, weakness, light-headedness and numbness.  Psychiatric/Behavioral: Negative for memory loss.     Physical Exam Updated Vital Signs BP (!) 160/100 (BP Location: Right Arm)   Pulse 70   Temp 97.9 F (36.6 C) (Oral)   Resp 18   Ht 5' 6.5" (1.689 m)   Wt 90.7 kg (200 lb)   LMP 08/01/2017   SpO2 100%   BMI 31.80 kg/m   Physical Exam  Constitutional: She appears well-developed and well-nourished.  HENT:  Head: Normocephalic. Head is without raccoon's eyes.    Tenderness over her left superior orbital  rim and also has an overlying 1 cm meter abrasion.  It does not pass through the dermis.  There is no active bleeding.  Eyes: Conjunctivae and EOM are normal. Pupils are equal, round, and reactive to light. Right eye exhibits no discharge. Left eye exhibits no discharge.  Neck: Neck supple.  Neurological: She is alert. GCS eye subscore is 4. GCS verbal subscore is 5. GCS motor subscore is 6.  Normal gait  Skin: Skin is warm and dry.  Psychiatric: She has a normal mood and affect.  Nursing note and vitals reviewed.    ED Treatments / Results  Labs (all labs ordered are listed, but only abnormal results are displayed) Labs Reviewed - No data to display  EKG  EKG Interpretation None       Radiology No results found.  Procedures Procedures (including critical care time)  Medications Ordered in ED Medications  ibuprofen (ADVIL,MOTRIN) tablet 800 mg (not administered)  Tdap (BOOSTRIX) injection 0.5 mL (not administered)     Initial Impression / Assessment and Plan / ED Course  I have reviewed the triage vital signs and the nursing notes.  Pertinent labs & imaging results that were available during my care of the patient were reviewed by me and considered in my medical decision making (see chart for details).     Nl neuro, doubt fracture. Nl eye exam. No indication for imaging.   Final Clinical Impressions(s) / ED Diagnoses   Final diagnoses:  Contusion of head, initial encounter    ED Discharge Orders        Ordered    ibuprofen (ADVIL,MOTRIN) 800 MG tablet  Every 8 hours PRN     08/08/17 0901       Hayden Rasmussen, MD 08/09/17 1220

## 2017-08-08 NOTE — Discharge Instructions (Signed)
You were evaluated in the emergency department for a contusion over your left eye.  There was also a small abrasion.  You should continue ice to the area and keep clean with soap and water and apply Neosporin to the abrasion.  We are prescribing ibuprofen to help with the pain.  We are also giving her a note for work today.  If you experience any worsening of any visual symptoms, or any significant head injury symptoms from the instructions were giving you should return to the emergency department.

## 2017-08-08 NOTE — ED Notes (Signed)
Ice pack given to Pt.

## 2018-10-01 ENCOUNTER — Emergency Department (HOSPITAL_BASED_OUTPATIENT_CLINIC_OR_DEPARTMENT_OTHER)
Admission: EM | Admit: 2018-10-01 | Discharge: 2018-10-01 | Disposition: A | Discharge status: Home / Self Care | Payer: BLUE CROSS/BLUE SHIELD | Attending: Emergency Medicine | Admitting: Emergency Medicine

## 2018-10-01 ENCOUNTER — Encounter (HOSPITAL_BASED_OUTPATIENT_CLINIC_OR_DEPARTMENT_OTHER): Payer: Self-pay

## 2018-10-01 ENCOUNTER — Other Ambulatory Visit: Payer: Self-pay

## 2018-10-01 ENCOUNTER — Emergency Department (HOSPITAL_BASED_OUTPATIENT_CLINIC_OR_DEPARTMENT_OTHER): Payer: BLUE CROSS/BLUE SHIELD

## 2018-10-01 DIAGNOSIS — Z79899 Other long term (current) drug therapy: Secondary | ICD-10-CM | POA: Diagnosis not present

## 2018-10-01 DIAGNOSIS — F17200 Nicotine dependence, unspecified, uncomplicated: Secondary | ICD-10-CM | POA: Diagnosis not present

## 2018-10-01 DIAGNOSIS — I10 Essential (primary) hypertension: Secondary | ICD-10-CM | POA: Insufficient documentation

## 2018-10-01 DIAGNOSIS — R102 Pelvic and perineal pain: Secondary | ICD-10-CM | POA: Diagnosis present

## 2018-10-01 DIAGNOSIS — N760 Acute vaginitis: Secondary | ICD-10-CM | POA: Insufficient documentation

## 2018-10-01 DIAGNOSIS — D259 Leiomyoma of uterus, unspecified: Secondary | ICD-10-CM | POA: Diagnosis not present

## 2018-10-01 DIAGNOSIS — B9689 Other specified bacterial agents as the cause of diseases classified elsewhere: Secondary | ICD-10-CM

## 2018-10-01 LAB — URINALYSIS, ROUTINE W REFLEX MICROSCOPIC
Bilirubin Urine: NEGATIVE
GLUCOSE, UA: NEGATIVE mg/dL
KETONES UR: NEGATIVE mg/dL
LEUKOCYTE UA: NEGATIVE
NITRITE: NEGATIVE
PH: 6 (ref 5.0–8.0)
Protein, ur: NEGATIVE mg/dL
SPECIFIC GRAVITY, URINE: 1.02 (ref 1.005–1.030)

## 2018-10-01 LAB — WET PREP, GENITAL
SPERM: NONE SEEN
TRICH WET PREP: NONE SEEN
YEAST WET PREP: NONE SEEN

## 2018-10-01 LAB — PREGNANCY, URINE: Preg Test, Ur: NEGATIVE

## 2018-10-01 LAB — URINALYSIS, MICROSCOPIC (REFLEX)

## 2018-10-01 MED ORDER — NAPROXEN 500 MG PO TABS: 500.0000 mg | ORAL_TABLET | Freq: Two times a day (BID) | ORAL | 0 refills | Status: DC

## 2018-10-01 MED ORDER — METRONIDAZOLE 500 MG PO TABS: 500.0000 mg | ORAL_TABLET | Freq: Two times a day (BID) | ORAL | 0 refills | Status: AC

## 2018-10-01 NOTE — ED Triage Notes (Signed)
Pelvic pain for two days, denies dysuria, denies vaginal discharge

## 2018-10-01 NOTE — ED Notes (Signed)
PT states understanding of care given, follow up care, and medication prescribed. PT ambulated from ED to car with a steady gait. 

## 2018-10-01 NOTE — ED Provider Notes (Signed)
[facility] [facility] EMERGENCY DEPARTMENT Provider Note   CSN: [id number] Arrival date & time: 10/01/18  1734    History   Chief Complaint Chief Complaint  Patient presents with   Pelvic Pain    HPI Yameli Delamater is a 43 y.o. female.     Patient is a 43 year old female who presents with pelvic pain.  She reports that she has been hurting for the last 2 to 3 days.  She has pressure feeling in her lower abdomen/pelvic area and feels like her uterus is falling down.  She denies any vaginal bleeding or discharge.  No nausea or vomiting.  No fevers.  She does have some constipation.  She did have a bowel movement yesterday but it was small.  She has been taking MiraLAX and stool softeners with no improvement.  No known fevers.  She did have a prior ovarian cyst which felt a little similar.     Past Medical History:  Diagnosis Date   Eczema    Hypertension     There are no active problems to display for this patient.   History reviewed. No pertinent surgical history.   OB History   No obstetric history on file.      Home Medications    Prior to Admission medications   Medication Sig Start Date End Date Taking? Authorizing Provider  amLODipine (NORVASC) 10 MG tablet Take by mouth. 09/19/18  Yes [provider]  metroNIDAZOLE (FLAGYL) 500 MG tablet Take 1 tablet (500 mg total) by mouth 2 (two) times daily. One po bid x 7 days 10/01/18   Malvin Johns, MD  naproxen (NAPROSYN) 500 MG tablet Take 1 tablet (500 mg total) by mouth 2 (two) times daily. 10/01/18   Malvin Johns, MD  triamcinolone cream (KENALOG) 0.1 % Apply 1 application topically 2 (two) times daily. 12/08/16   Forde Dandy, MD    Family History No family history on file.  Social History Social History   Tobacco Use   Smoking status: Current Every Day Smoker   Smokeless tobacco: Never Used  Substance Use Topics   Alcohol use: Yes    Comment: occ   Drug use: No     Allergies     Patient has no known allergies.   Review of Systems Review of Systems  Constitutional: Negative for chills, diaphoresis, fatigue and fever.  HENT: Negative for congestion, rhinorrhea and sneezing.   Eyes: Negative.   Respiratory: Negative for cough, chest tightness and shortness of breath.   Cardiovascular: Negative for chest pain and leg swelling.  Gastrointestinal: Negative for abdominal pain, blood in stool, diarrhea, nausea and vomiting.  Genitourinary: Positive for pelvic pain. Negative for difficulty urinating, flank pain, frequency and hematuria.  Musculoskeletal: Negative for arthralgias and back pain.  Skin: Negative for rash.  Neurological: Negative for dizziness, speech difficulty, weakness, numbness and headaches.     Physical Exam Updated Vital Signs BP (!) 141/79 (BP Location: Left Arm)    Pulse (!) 58    Temp 98.3 F (36.8 C) (Oral)    Resp 18    Ht 5\' 6"  (1.676 m)    Wt 90.7 kg    LMP 09/17/2018    SpO2 100%    BMI 32.28 kg/m   Physical Exam Constitutional:      Appearance: She is well-developed.  HENT:     Head: Normocephalic and atraumatic.  Eyes:     Pupils: Pupils are equal, round, and reactive to light.  Neck:  Musculoskeletal: Normal range of motion and neck supple.  Cardiovascular:     Rate and Rhythm: Normal rate and regular rhythm.     Heart sounds: Normal heart sounds.  Pulmonary:     Effort: Pulmonary effort is normal. No respiratory distress.     Breath sounds: Normal breath sounds. No wheezing or rales.  Chest:     Chest wall: No tenderness.  Abdominal:     General: Bowel sounds are normal.     Palpations: Abdomen is soft.     Tenderness: There is abdominal tenderness. There is no guarding or rebound.     Comments: Tenderness to the suprapubic area and left lower pelvic area  Genitourinary:    Comments: Positive tenderness in the suprapubic area but no cervical motion tenderness or adnexal tenderness.  Patient has a small amount of  white-gray discharge Musculoskeletal: Normal range of motion.  Lymphadenopathy:     Cervical: No cervical adenopathy.  Skin:    General: Skin is warm and dry.     Findings: No rash.  Neurological:     Mental Status: She is alert and oriented to person, place, and time.      ED Treatments / Results  Labs (all labs ordered are listed, but only abnormal results are displayed) Labs Reviewed  WET PREP, GENITAL - Abnormal; Notable for the following components:      Result Value   Clue Cells Wet Prep HPF POC PRESENT (*)    WBC, Wet Prep HPF POC FEW (*)    All other components within normal limits  URINALYSIS, ROUTINE W REFLEX MICROSCOPIC - Abnormal; Notable for the following components:   Hgb urine dipstick SMALL (*)    All other components within normal limits  URINALYSIS, MICROSCOPIC (REFLEX) - Abnormal; Notable for the following components:   Bacteria, UA RARE (*)    All other components within normal limits  PREGNANCY, URINE  GC/CHLAMYDIA PROBE AMP ([facility]) NOT AT [facility]    EKG None  Radiology US Transvaginal Non-ob  Result Date: 10/01/2018 CLINICAL DATA:  Pelvic pain for 3 days EXAM: TRANSABDOMINAL AND TRANSVAGINAL ULTRASOUND OF PELVIS DOPPLER ULTRASOUND OF OVARIES TECHNIQUE: Both transabdominal and transvaginal ultrasound examinations of the pelvis were performed. Transabdominal technique was performed for global imaging of the pelvis including uterus, ovaries, adnexal regions, and pelvic cul-de-sac. It was necessary to proceed with endovaginal exam following the transabdominal exam to visualize the uterus, endometrium, ovaries and adnexa. Color and duplex Doppler ultrasound was utilized to evaluate blood flow to the ovaries. COMPARISON:  Pelvic ultrasound 06/27/2017 FINDINGS: Uterus Measurements: 9.2 x 4.2 x 5.1 cm = volume: 103 mL. Rounded echogenic area posteriorly in the fundus measures up to 2.7 cm and may represent a fundal fibroid. Endometrium Thickness: 12 mm.  No  focal abnormality visualized. Right ovary Measurements: 3.8 x 3.0 x 2.4 cm = volume: 14 mL. Normal appearance/no adnexal mass. Small follicles. Left ovary Measurements: 3.4 x 2.2 x 2.2 cm = volume: 8.2 mL. Normal appearance/no adnexal mass. Pulsed Doppler evaluation of both ovaries demonstrates normal low-resistance arterial and venous waveforms. Other findings Small amount of free fluid in the pelvis. IMPRESSION: Echogenic area posteriorly in the uterine fundus, likely 2.7 cm fibroid. No adnexal/ovarian mass or evidence of ovarian torsion. Electronically Signed   By: Rolm Baptise M.D.   On: 10/01/2018 20:42   US Pelvis Complete  Result Date: 10/01/2018 CLINICAL DATA:  Pelvic pain for 3 days EXAM: TRANSABDOMINAL AND TRANSVAGINAL ULTRASOUND OF PELVIS DOPPLER ULTRASOUND OF OVARIES  TECHNIQUE: Both transabdominal and transvaginal ultrasound examinations of the pelvis were performed. Transabdominal technique was performed for global imaging of the pelvis including uterus, ovaries, adnexal regions, and pelvic cul-de-sac. It was necessary to proceed with endovaginal exam following the transabdominal exam to visualize the uterus, endometrium, ovaries and adnexa. Color and duplex Doppler ultrasound was utilized to evaluate blood flow to the ovaries. COMPARISON:  Pelvic ultrasound 06/27/2017 FINDINGS: Uterus Measurements: 9.2 x 4.2 x 5.1 cm = volume: 103 mL. Rounded echogenic area posteriorly in the fundus measures up to 2.7 cm and may represent a fundal fibroid. Endometrium Thickness: 12 mm.  No focal abnormality visualized. Right ovary Measurements: 3.8 x 3.0 x 2.4 cm = volume: 14 mL. Normal appearance/no adnexal mass. Small follicles. Left ovary Measurements: 3.4 x 2.2 x 2.2 cm = volume: 8.2 mL. Normal appearance/no adnexal mass. Pulsed Doppler evaluation of both ovaries demonstrates normal low-resistance arterial and venous waveforms. Other findings Small amount of free fluid in the pelvis. IMPRESSION: Echogenic area  posteriorly in the uterine fundus, likely 2.7 cm fibroid. No adnexal/ovarian mass or evidence of ovarian torsion. Electronically Signed   By: Rolm Baptise M.D.   On: 10/01/2018 20:42   [place] Art/ven Flow Abd Pelv Doppler  Result Date: 10/01/2018 CLINICAL DATA:  Pelvic pain for 3 days EXAM: TRANSABDOMINAL AND TRANSVAGINAL ULTRASOUND OF PELVIS DOPPLER ULTRASOUND OF OVARIES TECHNIQUE: Both transabdominal and transvaginal ultrasound examinations of the pelvis were performed. Transabdominal technique was performed for global imaging of the pelvis including uterus, ovaries, adnexal regions, and pelvic cul-de-sac. It was necessary to proceed with endovaginal exam following the transabdominal exam to visualize the uterus, endometrium, ovaries and adnexa. Color and duplex Doppler ultrasound was utilized to evaluate blood flow to the ovaries. COMPARISON:  Pelvic ultrasound 06/27/2017 FINDINGS: Uterus Measurements: 9.2 x 4.2 x 5.1 cm = volume: 103 mL. Rounded echogenic area posteriorly in the fundus measures up to 2.7 cm and may represent a fundal fibroid. Endometrium Thickness: 12 mm.  No focal abnormality visualized. Right ovary Measurements: 3.8 x 3.0 x 2.4 cm = volume: 14 mL. Normal appearance/no adnexal mass. Small follicles. Left ovary Measurements: 3.4 x 2.2 x 2.2 cm = volume: 8.2 mL. Normal appearance/no adnexal mass. Pulsed Doppler evaluation of both ovaries demonstrates normal low-resistance arterial and venous waveforms. Other findings Small amount of free fluid in the pelvis. IMPRESSION: Echogenic area posteriorly in the uterine fundus, likely 2.7 cm fibroid. No adnexal/ovarian mass or evidence of ovarian torsion. Electronically Signed   By: Rolm Baptise M.D.   On: 10/01/2018 20:42    Procedures Procedures (including critical care time)  Medications Ordered in ED Medications - No data to display   Initial Impression / Assessment and Plan / ED Course  I have reviewed the triage vital signs and the  nursing notes.  Pertinent labs & imaging results that were available during my care of the patient were reviewed by me and considered in my medical decision making (see chart for details).        Patient is a 42 year old female who presents with suprapubic pain.  Her urine does not appear infected.  Her pregnancy test is negative.  She had a pelvic ultrasound which shows uterine fibroid.  This likely could be the etiology of the pain.  She also had some evidence of bacterial vaginosis on pelvic exam.  She was started on Flagyl and Naprosyn for symptomatic relief.  She was encouraged to follow-up at the [facility] outpatient center.  Return precautions were given.  Final Clinical Impressions(s) / ED Diagnoses   Final diagnoses:  Uterine leiomyoma, unspecified location  BV (bacterial vaginosis)    ED Discharge Orders         Ordered    metroNIDAZOLE (FLAGYL) 500 MG tablet  2 times daily     10/01/18 2106    naproxen (NAPROSYN) 500 MG tablet  2 times daily     10/01/18 2106           Malvin Johns, MD 10/01/18 2109

## 2018-10-02 LAB — GC/CHLAMYDIA PROBE AMP (~~LOC~~) NOT AT ARMC
Chlamydia: NEGATIVE
Neisseria Gonorrhea: NEGATIVE

## 2019-01-04 ENCOUNTER — Other Ambulatory Visit: Payer: Self-pay

## 2019-01-04 ENCOUNTER — Encounter (HOSPITAL_BASED_OUTPATIENT_CLINIC_OR_DEPARTMENT_OTHER): Payer: Self-pay | Admitting: *Deleted

## 2019-01-04 ENCOUNTER — Emergency Department (HOSPITAL_BASED_OUTPATIENT_CLINIC_OR_DEPARTMENT_OTHER)
Admission: EM | Admit: 2019-01-04 | Discharge: 2019-01-04 | Disposition: A | Discharge status: Home / Self Care | Payer: BC Managed Care – PPO | Attending: Emergency Medicine | Admitting: Emergency Medicine

## 2019-01-04 DIAGNOSIS — R070 Pain in throat: Secondary | ICD-10-CM | POA: Diagnosis present

## 2019-01-04 DIAGNOSIS — J020 Streptococcal pharyngitis: Secondary | ICD-10-CM | POA: Diagnosis not present

## 2019-01-04 DIAGNOSIS — Z79899 Other long term (current) drug therapy: Secondary | ICD-10-CM | POA: Insufficient documentation

## 2019-01-04 DIAGNOSIS — I10 Essential (primary) hypertension: Secondary | ICD-10-CM | POA: Insufficient documentation

## 2019-01-04 DIAGNOSIS — F17200 Nicotine dependence, unspecified, uncomplicated: Secondary | ICD-10-CM | POA: Insufficient documentation

## 2019-01-04 LAB — GROUP A STREP BY PCR: Group A Strep by PCR: DETECTED — AB

## 2019-01-04 MED ORDER — IBUPROFEN 800 MG PO TABS
800.0000 mg | ORAL_TABLET | Freq: Once | ORAL | Status: AC
  Administered 2019-01-04: 11:00:00 800 mg via ORAL
  Filled 2019-01-04: qty 1

## 2019-01-04 MED ORDER — DEXAMETHASONE 6 MG PO TABS
10.0000 mg | ORAL_TABLET | Freq: Once | ORAL | Status: AC
  Administered 2019-01-04: 10 mg via ORAL
  Filled 2019-01-04: qty 1

## 2019-01-04 MED ORDER — PENICILLIN G BENZATHINE 1200000 UNIT/2ML IM SUSP
1.2000 10*6.[IU] | Freq: Once | INTRAMUSCULAR | Status: AC
  Administered 2019-01-04: 12:00:00 1.2 10*6.[IU] via INTRAMUSCULAR
  Filled 2019-01-04: qty 2

## 2019-01-04 MED ORDER — LIDOCAINE VISCOUS HCL 2 % MT SOLN
15.0000 mL | Freq: Once | OROMUCOSAL | Status: AC
  Administered 2019-01-04: 15 mL via OROMUCOSAL
  Filled 2019-01-04: qty 15

## 2019-01-04 NOTE — ED Triage Notes (Signed)
Woke w sore throat this am, dii swallowing  Had strep 1 month ago

## 2019-01-04 NOTE — ED Provider Notes (Signed)
[facility] EMERGENCY DEPARTMENT Provider Note   CSN: [id number] Arrival date & time: 01/04/19  1021    History   Chief Complaint Chief Complaint  Patient presents with  . Sore Throat    HPI Christina Mayer is a 43 y.o. female.     The history is provided by the patient and medical records. No language interpreter was used.  Sore Throat   Christina Mayer is a 43 y.o. female who presents to the Emergency Department complaining of sore throat. Presents to the emergency department complaining of sore throat that began this morning upon waking. She was in her routine state of health yesterday. When she woke this morning she complains of generalized sore throat with pain on swallowing. She has difficulty swallowing but is able to handle her secretions. No reports of fever at home. No shortness of breath, cough, abdominal pain, nausea, vomiting. No known coronavirus exposures. She did have strep throat one month ago and this is similar. Symptoms are moderate and constant nature. No new food or allergen exposure. Past Medical History:  Diagnosis Date  . Eczema   . Hypertension     There are no active problems to display for this patient.   History reviewed. No pertinent surgical history.   OB History   No obstetric history on file.      Home Medications    Prior to Admission medications   Medication Sig Start Date End Date Taking? Authorizing Provider  amLODipine (NORVASC) 10 MG tablet Take by mouth. 09/19/18   [provider]  metroNIDAZOLE (FLAGYL) 500 MG tablet Take 1 tablet (500 mg total) by mouth 2 (two) times daily. One po bid x 7 days 10/01/18   Malvin Johns, MD  naproxen (NAPROSYN) 500 MG tablet Take 1 tablet (500 mg total) by mouth 2 (two) times daily. 10/01/18   Malvin Johns, MD  triamcinolone cream (KENALOG) 0.1 % Apply 1 application topically 2 (two) times daily. 12/08/16   Forde Dandy, MD    Family History No family history on file.   Social History Social History   Tobacco Use  . Smoking status: Current Every Day Smoker  . Smokeless tobacco: Never Used  Substance Use Topics  . Alcohol use: Yes    Comment: occ  . Drug use: No     Allergies   Patient has no known allergies.   Review of Systems Review of Systems  All other systems reviewed and are negative.    Physical Exam Updated Vital Signs BP 139/90 (BP Location: Left Arm)   Pulse 78   Temp 100.2 F (37.9 C) (Oral)   Resp 18   Ht 5\' 6"  (1.676 m)   Wt 88.2 kg   LMP 01/04/2019   SpO2 100%   BMI 31.38 kg/m   Physical Exam Vitals signs and nursing note reviewed.  Constitutional:      Appearance: She is well-developed.  HENT:     Head: Normocephalic and atraumatic.     Mouth/Throat:     Mouth: Mucous membranes are moist.     Comments: Moderate tonsilar erythema and edema with tonsilar exudates bilaterally. No PTA. Normal voice. Neck:     Musculoskeletal: Neck supple.  Cardiovascular:     Rate and Rhythm: Normal rate and regular rhythm.  Pulmonary:     Effort: Pulmonary effort is normal. No respiratory distress.  Musculoskeletal:        General: No swelling or tenderness.  Lymphadenopathy:     Cervical: Cervical adenopathy  present.  Skin:    General: Skin is warm and dry.  Neurological:     Mental Status: She is alert and oriented to person, place, and time.  Psychiatric:        Mood and Affect: Mood normal.        Behavior: Behavior normal.      ED Treatments / Results  Labs (all labs ordered are listed, but only abnormal results are displayed) Labs Reviewed  GROUP A STREP BY PCR - Abnormal; Notable for the following components:      Result Value   Group A Strep by PCR DETECTED (*)    All other components within normal limits    EKG None  Radiology No results found.  Procedures Procedures (including critical care time)  Medications Ordered in ED Medications  penicillin g benzathine (BICILLIN LA) 1200000 UNIT/2ML  injection 1.2 Million Units (has no administration in time range)  lidocaine (XYLOCAINE) 2 % viscous mouth solution 15 mL (15 mLs Mouth/Throat Given 01/04/19 1119)  dexamethasone (DECADRON) tablet 10 mg (10 mg Oral Given 01/04/19 1119)  ibuprofen (ADVIL) tablet 800 mg (800 mg Oral Given 01/04/19 1119)     Initial Impression / Assessment and Plan / ED Course  I have reviewed the triage vital signs and the nursing notes.  Pertinent labs & imaging results that were available during my care of the patient were reviewed by me and considered in my medical decision making (see chart for details).       patient here for evaluation of sore throat that began today. She is non-toxic appearing on evaluation. Examination is consistent with pharyngitis, strep swab is positive. Presentation is not consistent with sepsis, RPA, PTA, epiglotitis. Counseled patient on home care for strep pharyngitis. Discussed outpatient follow-up and return precautions.  Final Clinical Impressions(s) / ED Diagnoses   Final diagnoses:  Strep pharyngitis    ED Discharge Orders    None       Quintella Reichert, MD 01/04/19 1138

## 2019-05-22 ENCOUNTER — Emergency Department (HOSPITAL_BASED_OUTPATIENT_CLINIC_OR_DEPARTMENT_OTHER)
Admission: EM | Admit: 2019-05-22 | Discharge: 2019-05-22 | Disposition: A | Discharge status: Home / Self Care | Payer: BC Managed Care – PPO | Attending: Emergency Medicine | Admitting: Emergency Medicine

## 2019-05-22 ENCOUNTER — Encounter (HOSPITAL_BASED_OUTPATIENT_CLINIC_OR_DEPARTMENT_OTHER): Payer: Self-pay | Admitting: Emergency Medicine

## 2019-05-22 ENCOUNTER — Emergency Department (HOSPITAL_BASED_OUTPATIENT_CLINIC_OR_DEPARTMENT_OTHER): Payer: BC Managed Care – PPO

## 2019-05-22 ENCOUNTER — Other Ambulatory Visit: Payer: Self-pay

## 2019-05-22 DIAGNOSIS — M25561 Pain in right knee: Secondary | ICD-10-CM

## 2019-05-22 DIAGNOSIS — F17200 Nicotine dependence, unspecified, uncomplicated: Secondary | ICD-10-CM | POA: Insufficient documentation

## 2019-05-22 DIAGNOSIS — I10 Essential (primary) hypertension: Secondary | ICD-10-CM | POA: Insufficient documentation

## 2019-05-22 DIAGNOSIS — Z79899 Other long term (current) drug therapy: Secondary | ICD-10-CM | POA: Insufficient documentation

## 2019-05-22 MED ORDER — NAPROXEN 500 MG PO TABS: 500.0000 mg | ORAL_TABLET | Freq: Two times a day (BID) | ORAL | 0 refills | Status: AC

## 2019-05-22 NOTE — ED Notes (Signed)
Pt decided the knee immobilizer was too bulky for her job. Reported to [place], [place]

## 2019-05-22 NOTE — ED Triage Notes (Signed)
R knee pain since yesterday. Denies injury. States it keeps giving out.

## 2019-05-22 NOTE — Discharge Instructions (Signed)
Please read and follow all provided instructions.  You have been seen today for right knee pain.   Tests performed today include: An x-ray of the affected area - does NOT show any broken bones or dislocations.  Vital signs. See below for your results today.   Home care instructions: -- *PRICE in the first 24-48 hours  Protect (with brace, splint, sling), if given by your provider Rest Ice- Do not apply ice pack directly to your skin, place towel or similar between your skin and ice/ice pack. Apply ice for 20 min, then remove for 40 min while awake Compression- Wear brace, elastic bandage, splint as directed by your provider Elevate affected extremity above the level of your heart when not walking around for the first 24-48 hours   Medications:  - Naproxen is a nonsteroidal anti-inflammatory medication that will help with pain and swelling. Be sure to take this medication as prescribed with food, 1 pill every 12 hours,  It should be taken with food, as it can cause stomach upset, and more seriously, stomach bleeding. Do not take other nonsteroidal anti-inflammatory medications with this such as Advil, Motrin, Aleve, Mobic, Goodie Powder, or Motrin.    You make take Tylenol per over the counter dosing with these medications.   We have prescribed you new medication(s) today. Discuss the medications prescribed today with your pharmacist as they can have adverse effects and interactions with your other medicines including over the counter and prescribed medications. Seek medical evaluation if you start to experience new or abnormal symptoms after taking one of these medicines, seek care immediately if you start to experience difficulty breathing, feeling of your throat closing, facial swelling, or rash as these could be indications of a more serious allergic reaction   Follow-up instructions: Please follow-up with your primary care provider or the provided sports medicine physician if you continue  to have significant pain in 1 week. In this case you may have a more severe injury that requires further care.   Return instructions:  Please return if your digits or extremity are numb or tingling, appear gray or blue, or you have severe pain (also elevate the extremity and loosen splint or wrap if you were given one) Please return if you have redness or fevers.  Please return to the Emergency Department if you experience worsening symptoms.  Please return if you have any other emergent concerns. Additional Information:  Your vital signs today were: BP (!) 156/84 (BP Location: Left Arm)    Pulse 63    Temp 98.9 F (37.2 C) (Oral)    Resp 16    Ht 5\' 6"  (1.676 m)    Wt 88.9 kg    LMP 05/20/2019    SpO2 99%    BMI 31.64 kg/m  If your blood pressure (BP) was elevated above 135/85 this visit, please have this repeated by your doctor within one month. ---------------

## 2019-05-22 NOTE — ED Provider Notes (Addendum)
[facility] EMERGENCY DEPARTMENT Provider Note   CSN: [id number] Arrival date & time: 05/22/19  1052     History   Chief Complaint Chief Complaint  Patient presents with  . Knee Pain    HPI Christina Mayer is a 43 y.o. female with a hx of tobacco abuse & HTN who presents to the ED w/ complaints of R knee pain since yesterday. Patient states she has had pain with intermittent giving out to the R knee. Discomfort is primarily medially. Worse when it gives out or with certain positions/movements. No alleviating factors. No intervention PTA. No specific direct trauma or change in activity. Denies fever, chills, redness, numbness, tingling, weakness, or other areas of pain.     HPI  Past Medical History:  Diagnosis Date  . Eczema   . Hypertension     There are no active problems to display for this patient.   History reviewed. No pertinent surgical history.   OB History   No obstetric history on file.      Home Medications    Prior to Admission medications   Medication Sig Start Date End Date Taking? Authorizing Provider  amLODipine (NORVASC) 10 MG tablet Take by mouth. 09/19/18   [provider]  metroNIDAZOLE (FLAGYL) 500 MG tablet Take 1 tablet (500 mg total) by mouth 2 (two) times daily. One po bid x 7 days 10/01/18   Malvin Johns, MD  naproxen (NAPROSYN) 500 MG tablet Take 1 tablet (500 mg total) by mouth 2 (two) times daily. 10/01/18   Malvin Johns, MD  triamcinolone cream (KENALOG) 0.1 % Apply 1 application topically 2 (two) times daily. 12/08/16   Forde Dandy, MD    Family History No family history on file.  Social History Social History   Tobacco Use  . Smoking status: Current Every Day Smoker  . Smokeless tobacco: Never Used  Substance Use Topics  . Alcohol use: Yes    Comment: occ  . Drug use: No     Allergies   Patient has no known allergies.   Review of Systems Review of Systems  Constitutional: Negative for chills and  fever.  Cardiovascular: Negative for leg swelling.  Musculoskeletal: Positive for arthralgias.  Skin: Negative for color change and wound.  Neurological: Negative for weakness.     Physical Exam Updated Vital Signs BP (!) 156/84 (BP Location: Left Arm)   Pulse 63   Temp 98.9 F (37.2 C) (Oral)   Resp 16   Ht 5\' 6"  (1.676 m)   Wt 88.9 kg   LMP 05/20/2019   SpO2 99%   BMI 31.64 kg/m   Physical Exam Vitals signs and nursing note reviewed.  Constitutional:      General: She is not in acute distress.    Appearance: She is not ill-appearing or toxic-appearing.  HENT:     Head: Normocephalic and atraumatic.  Cardiovascular:     Pulses:          Dorsalis pedis pulses are 2+ on the right side and 2+ on the left side.       Posterior tibial pulses are 2+ on the right side and 2+ on the left side.  Pulmonary:     Effort: Pulmonary effort is normal.  Musculoskeletal:     Comments: Lower extremities: No obvious deformity, appreciable swelling, edema, erythema, ecchymosis, warmth, or open wounds. Patient has intact AROM to bilateral hips, knees, ankles, and all digits. Tender to palpation to the medial aspect of the R  knee including medial joint line. Otherwise nontender. No calf tenderness. Compartments are soft. No obvious joint instability. Negative varus/valgus testing.   Skin:    General: Skin is warm and dry.     Capillary Refill: Capillary refill takes less than 2 seconds.  Neurological:     Mental Status: She is alert.     Comments: Alert. Clear speech. Sensation grossly intact to bilateral lower extremities. 5/5 strength with knee flexion/extension & ankle plantar/dorsiflexion bilaterally.   Psychiatric:        Mood and Affect: Mood normal.        Behavior: Behavior normal.    ED Treatments / Results  Labs (all labs ordered are listed, but only abnormal results are displayed) Labs Reviewed - No data to display  EKG None  Radiology Dg Knee Complete 4 Views Right   Result Date: 05/22/2019 CLINICAL DATA:  Pain in right knee, no history of injury. No previous injury or symptoms. EXAM: RIGHT KNEE - COMPLETE 4+ VIEW COMPARISON:  None. FINDINGS: No evidence of fracture, dislocation, or joint effusion. No evidence of arthropathy or other focal bone abnormality. Soft tissues are unremarkable. IMPRESSION: Negative. Electronically Signed   By: Zetta Bills M.D.   On: 05/22/2019 11:54    Procedures Procedures (including critical care time)    Medications Ordered in ED Medications - No data to display   Initial Impression / Assessment and Plan / ED Course  I have reviewed the triage vital signs and the nursing notes.  Pertinent labs & imaging results that were available during my care of the patient were reviewed by me and considered in my medical decision making (see chart for details).   Patient presents to the ED w/ complaints of R knee pain & giving out since yesterday> nontoxic appearing, in no apparent distress, vitals WNL with the exception of elevated BP- doubt HTN emergency. Tender medially, NVI distally. No fever, redness, warmth, or ROM limitation to suggest septic joint. No edema or calf tenderness- does not seem consistent w/ DVT. Xray w/o fx/dislocation. Will place in knee immobilizer, trial Naproxen, sports medicine follow up. I discussed results, treatment plan, need for follow-up, and return precautions with the patient. Provided opportunity for questions, patient confirmed understanding and is in agreement with plan.    Final Clinical Impressions(s) / ED Diagnoses   Final diagnoses:  Acute pain of right knee    ED Discharge Orders         Ordered    naproxen (NAPROSYN) 500 MG tablet  2 times daily     05/22/19 1227           Vaun Hyndman, [place] R, PA-C 05/22/19 [address] PA-C 05/22/19 1249    Maudie Flakes, MD 05/23/19 1500

## 2019-06-09 ENCOUNTER — Other Ambulatory Visit: Payer: Self-pay

## 2019-06-09 ENCOUNTER — Encounter (HOSPITAL_BASED_OUTPATIENT_CLINIC_OR_DEPARTMENT_OTHER): Payer: Self-pay | Admitting: Emergency Medicine

## 2019-06-09 ENCOUNTER — Emergency Department (HOSPITAL_BASED_OUTPATIENT_CLINIC_OR_DEPARTMENT_OTHER)
Admission: EM | Admit: 2019-06-09 | Discharge: 2019-06-09 | Disposition: A | Discharge status: Home / Self Care | Payer: BC Managed Care – PPO | Attending: Emergency Medicine | Admitting: Emergency Medicine

## 2019-06-09 DIAGNOSIS — Z20828 Contact with and (suspected) exposure to other viral communicable diseases: Secondary | ICD-10-CM | POA: Diagnosis not present

## 2019-06-09 DIAGNOSIS — R110 Nausea: Secondary | ICD-10-CM | POA: Insufficient documentation

## 2019-06-09 DIAGNOSIS — M7918 Myalgia, other site: Secondary | ICD-10-CM | POA: Insufficient documentation

## 2019-06-09 DIAGNOSIS — I10 Essential (primary) hypertension: Secondary | ICD-10-CM | POA: Diagnosis not present

## 2019-06-09 DIAGNOSIS — R6883 Chills (without fever): Secondary | ICD-10-CM | POA: Insufficient documentation

## 2019-06-09 DIAGNOSIS — R070 Pain in throat: Secondary | ICD-10-CM | POA: Diagnosis not present

## 2019-06-09 DIAGNOSIS — Z79899 Other long term (current) drug therapy: Secondary | ICD-10-CM | POA: Insufficient documentation

## 2019-06-09 DIAGNOSIS — F17200 Nicotine dependence, unspecified, uncomplicated: Secondary | ICD-10-CM | POA: Insufficient documentation

## 2019-06-09 DIAGNOSIS — J029 Acute pharyngitis, unspecified: Secondary | ICD-10-CM

## 2019-06-09 LAB — GROUP A STREP BY PCR: Group A Strep by PCR: NOT DETECTED

## 2019-06-09 LAB — SARS CORONAVIRUS 2 AG (30 MIN TAT): SARS Coronavirus 2 Ag: NEGATIVE

## 2019-06-09 MED ORDER — ONDANSETRON 4 MG PO TBDP: 4.0000 mg | ORAL_TABLET | Freq: Three times a day (TID) | ORAL | 0 refills | Status: DC | PRN

## 2019-06-09 MED ORDER — DEXAMETHASONE 6 MG PO TABS
10.0000 mg | ORAL_TABLET | Freq: Once | ORAL | Status: AC
  Administered 2019-06-09: 10 mg via ORAL
  Filled 2019-06-09: qty 1

## 2019-06-09 NOTE — ED Notes (Signed)
ED Provider at bedside. 

## 2019-06-09 NOTE — Discharge Instructions (Addendum)
Sore Throat  You have been seen today for sore throat.  The strep test was negative.  This usually indicates a viral infection.  Viral infections do not respond to antibiotics.  Your body has to fight off the infection and it needs to run its course. Hand washing: Wash your hands throughout the day, but especially before and after touching the face, using the restroom, sneezing, coughing, or touching surfaces that have been coughed or sneezed upon. Hydration: Symptoms will be intensified and complicated by dehydration. Dehydration can also extend the duration of symptoms. Drink plenty of fluids and get plenty of rest. You should be drinking at least half a liter of water an hour to stay hydrated. Electrolyte drinks (ex. Gatorade, Powerade, Pedialyte) are also encouraged. You should be drinking enough fluids to make your urine light yellow, almost clear. If this is not the case, you are not drinking enough water. Please note that some of the treatments indicated below will not be effective if you are not adequately hydrated. Diet: Please concentrate on hydration, however, you may introduce food slowly.  Start with a clear liquid diet, progressed to a full liquid diet, and then bland solids as you are able. Pain or fever: Ibuprofen, Naproxen, or Tylenol for pain or fever. (see below for suggested regimen) Antiinflammatory medications: Take 600 mg of ibuprofen every 6 hours or 440 mg (over the counter dose) to 500 mg (prescription dose) of naproxen every 12 hours for the next 3 days. After this time, these medications may be used as needed for pain. Take these medications with food to avoid upset stomach. Choose only one of these medications, do not take them together. Tylenol: Should you continue to have additional pain while taking the ibuprofen or naproxen, you may add in tylenol as needed. Your daily total maximum amount of tylenol from all sources should be limited to 4000mg /day for persons without liver  problems, or 2000mg /day for those with liver problems. Sore throat: Warm liquids or Chloraseptic spray may help soothe a sore throat. Gargle twice a day with a salt water solution made from a half teaspoon of salt in a cup of warm water.  Follow up: Follow up with a primary care provider, as needed, for any future management of this issue.  Due to repeat sore throats this year, may need to follow-up with the ear nose and throat specialist. Return: Return to the emergency department for drooling, difficulty breathing, inability to swallow when lying back, chest pain, or any other major concerns.  For prescription assistance, may try using prescription discount sites or apps, such as goodrx.com  You have a test pending for COVID-19.  Results typically return within about 48 hours.  Be sure to check MyChart for updated results.  We recommend isolating yourself until results are received.  Patients who have symptoms consistent with COVID-19 should self isolated for: At least 3 days (72 hours) have passed since recovery, defined as resolution of fever without the use of fever reducing medications and improvement in respiratory symptoms (e.g., cough, shortness of breath), and At least 7 days have passed since symptoms first appeared.  If you have no symptoms, but your test returns positive, recommend isolating for at least 10 days.

## 2019-06-09 NOTE — ED Triage Notes (Signed)
Pt reports sore throat, chills and body aches and weakness x 2 days. Pt denies cough, unknown fever.

## 2019-06-09 NOTE — ED Provider Notes (Signed)
[name] EMERGENCY DEPARTMENT Provider Note   CSN: [id number] Arrival date & time: 06/09/19  1036     History   Chief Complaint No chief complaint on file.   HPI Christina Mayer is a 43 y.o. female.     HPI   Christina Mayer is a 43 y.o. female, with a history of HTN, presenting to the ED with sore throat beginning yesterday.  Pain is bilateral, aching/sharp, moderate to severe, nonradiating.  Accompanied by nausea, chills, and body aches. Patient states she has had similar presentations in the past with strep throat.  This is her third such sore throat in the past year. She also notes when she was seen in June, tested positive for strep throat, and was treated with penicillin G IM injection, the next day she began to develop hives.  She states she has had oral penicillin in the past without issue.  She also mentions that during the visit in June they administered viscous lidocaine orally for symptom management and this was the first time she had this medication.  Denies known fever, cough, shortness of breath, chest pain, drooling, vomiting, diarrhea, rash, or any other complaints.    Past Medical History:  Diagnosis Date  . Eczema   . Hypertension     There are no active problems to display for this patient.   History reviewed. No pertinent surgical history.   OB History   No obstetric history on file.      Home Medications    Prior to Admission medications   Medication Sig Start Date End Date Taking? Authorizing Provider  amLODipine (NORVASC) 10 MG tablet Take by mouth. 09/19/18   [provider]  COSENTYX SENSOREADY, 300 MG, 150 MG/ML SOAJ Inject 2 Syringes into the skin every 28 (twenty-eight) days. 05/22/19   [provider]  metroNIDAZOLE (FLAGYL) 500 MG tablet Take 1 tablet (500 mg total) by mouth 2 (two) times daily. One po bid x 7 days 10/01/18   Malvin Johns, MD  naproxen (NAPROSYN) 500 MG tablet Take 1 tablet (500 mg total)  by mouth 2 (two) times daily. 05/22/19   Petrucelli, Samantha R, PA-C  ondansetron (ZOFRAN ODT) 4 MG disintegrating tablet Take 1 tablet (4 mg total) by mouth every 8 (eight) hours as needed for nausea or vomiting. 06/09/19   Joy, Shawn C, PA-C  triamcinolone cream (KENALOG) 0.1 % Apply 1 application topically 2 (two) times daily. 12/08/16   Forde Dandy, MD    Family History History reviewed. No pertinent family history.  Social History Social History   Tobacco Use  . Smoking status: Current Every Day Smoker  . Smokeless tobacco: Never Used  Substance Use Topics  . Alcohol use: Yes    Comment: occ  . Drug use: No     Allergies   Penicillin g   Review of Systems Review of Systems  Constitutional: Positive for chills. Negative for fever.  HENT: Positive for sore throat. Negative for congestion and voice change.   Respiratory: Negative for cough and shortness of breath.   Cardiovascular: Negative for chest pain.  Gastrointestinal: Positive for nausea. Negative for abdominal pain, diarrhea and vomiting.  Musculoskeletal: Positive for myalgias. Negative for neck pain and neck stiffness.  All other systems reviewed and are negative.    Physical Exam Updated Vital Signs BP (!) 153/76 (BP Location: Right Arm)   Pulse (!) 56   Temp 99 F (37.2 C) (Oral)   Resp 18   Ht 5\' 6"  (  1.676 m)   Wt 88.9 kg   LMP 06/09/2019 (Exact Date)   BMI 31.64 kg/m   Physical Exam Vitals signs and nursing note reviewed.  Constitutional:      General: She is not in acute distress.    Appearance: She is well-developed. She is not diaphoretic.  HENT:     Head: Normocephalic and atraumatic.     Mouth/Throat:     Mouth: Mucous membranes are moist.     Pharynx: Uvula midline. Pharyngeal swelling, oropharyngeal exudate and posterior oropharyngeal erythema present.     Comments: No trismus or noted abnormal phonation.  Mouth opening to at least 3 finger widths.  Handles oral secretions without  difficulty.  No noted facial swelling.  No sublingual swelling.  No swelling to the submental or submandibular regions.  No swelling or tenderness into the soft tissues of the neck. Eyes:     Conjunctiva/sclera: Conjunctivae normal.  Neck:     Musculoskeletal: Neck supple.  Cardiovascular:     Rate and Rhythm: Normal rate and regular rhythm.     Pulses: Normal pulses.  Pulmonary:     Effort: Pulmonary effort is normal. No respiratory distress.     Breath sounds: Normal breath sounds.  Abdominal:     Tenderness: There is no guarding.  Lymphadenopathy:     Cervical: Cervical adenopathy present.  Skin:    General: Skin is warm and dry.  Neurological:     Mental Status: She is alert.  Psychiatric:        Mood and Affect: Mood and affect normal.        Speech: Speech normal.        Behavior: Behavior normal.      ED Treatments / Results  Labs (all labs ordered are listed, but only abnormal results are displayed) Labs Reviewed  GROUP A STREP BY PCR  SARS CORONAVIRUS 2 AG (30 MIN TAT)  NOVEL CORONAVIRUS, NAA (HOSP ORDER, SEND-OUT TO REF LAB; TAT 18-24 HRS)    EKG None  Radiology No results found.  Procedures Procedures (including critical care time)  Medications Ordered in ED Medications  dexamethasone (DECADRON) tablet 10 mg (10 mg Oral Given 06/09/19 1125)     Initial Impression / Assessment and Plan / ED Course  I have reviewed the triage vital signs and the nursing notes.  Pertinent labs & imaging results that were available during my care of the patient were reviewed by me and considered in my medical decision making (see chart for details).        Patient presents with sore throat beginning yesterday. Patient is nontoxic appearing, afebrile, not tachycardic, not tachypneic, not hypotensive, maintains excellent SPO2 on room air, and is in no apparent distress.  She does not have a presentation suggestive of PTA, RPA, airway obstruction or Ludwig's  angioedema. Strep test negative.  Rapid Covid test negative, which does not have an acceptable negative predictive value, therefore additional Covid test is pending at time of discharge. The patient was given instructions for home care as well as return precautions. Patient voices understanding of these instructions, accepts the plan, and is comfortable with discharge.   Vitals:   06/09/19 1043 06/09/19 1044 06/09/19 1125 06/09/19 1249  BP: (!) 153/76   (!) 136/91  Pulse: (!) 56   (!) 57  Resp: 18   18  Temp: 99 F (37.2 C)     TempSrc: Oral     SpO2: 100%  100% 100%  Weight:  88.9 kg  Height:  5\' 6"  (1.676 m)       Final Clinical Impressions(s) / ED Diagnoses   Final diagnoses:  Sore throat    ED Discharge Orders         Ordered    ondansetron (ZOFRAN ODT) 4 MG disintegrating tablet  Every 8 hours PRN     06/09/19 1426           Lorayne Bender, PA-C 06/09/19 1433    Davonna Belling, MD 06/09/19 1513

## 2019-06-11 LAB — NOVEL CORONAVIRUS, NAA (HOSP ORDER, SEND-OUT TO REF LAB; TAT 18-24 HRS): SARS-CoV-2, NAA: NOT DETECTED

## 2020-04-02 ENCOUNTER — Encounter (HOSPITAL_BASED_OUTPATIENT_CLINIC_OR_DEPARTMENT_OTHER): Payer: Self-pay | Admitting: Emergency Medicine

## 2020-04-02 ENCOUNTER — Emergency Department (HOSPITAL_BASED_OUTPATIENT_CLINIC_OR_DEPARTMENT_OTHER): Payer: Medicaid Other

## 2020-04-02 ENCOUNTER — Other Ambulatory Visit: Payer: Self-pay

## 2020-04-02 ENCOUNTER — Emergency Department (HOSPITAL_BASED_OUTPATIENT_CLINIC_OR_DEPARTMENT_OTHER)
Admission: EM | Admit: 2020-04-02 | Discharge: 2020-04-02 | Disposition: A | Discharge status: Home / Self Care | Payer: Medicaid Other | Attending: Emergency Medicine | Admitting: Emergency Medicine

## 2020-04-02 DIAGNOSIS — F17200 Nicotine dependence, unspecified, uncomplicated: Secondary | ICD-10-CM | POA: Diagnosis not present

## 2020-04-02 DIAGNOSIS — R05 Cough: Secondary | ICD-10-CM | POA: Diagnosis present

## 2020-04-02 DIAGNOSIS — J069 Acute upper respiratory infection, unspecified: Secondary | ICD-10-CM | POA: Diagnosis not present

## 2020-04-02 DIAGNOSIS — Z20822 Contact with and (suspected) exposure to covid-19: Secondary | ICD-10-CM | POA: Diagnosis not present

## 2020-04-02 DIAGNOSIS — Z79899 Other long term (current) drug therapy: Secondary | ICD-10-CM | POA: Diagnosis not present

## 2020-04-02 DIAGNOSIS — I10 Essential (primary) hypertension: Secondary | ICD-10-CM | POA: Diagnosis not present

## 2020-04-02 DIAGNOSIS — L309 Dermatitis, unspecified: Secondary | ICD-10-CM | POA: Diagnosis not present

## 2020-04-02 LAB — SARS CORONAVIRUS 2 BY RT PCR (HOSPITAL ORDER, PERFORMED IN ~~LOC~~ HOSPITAL LAB): SARS Coronavirus 2: NEGATIVE

## 2020-04-02 MED ORDER — PREDNISONE 20 MG PO TABS: 60.0000 mg | ORAL_TABLET | Freq: Every day | ORAL | 0 refills | Status: AC

## 2020-04-02 MED ORDER — IBUPROFEN 400 MG PO TABS
600.0000 mg | ORAL_TABLET | Freq: Once | ORAL | Status: AC
  Administered 2020-04-02: 600 mg via ORAL
  Filled 2020-04-02: qty 1

## 2020-04-02 MED ORDER — IBUPROFEN 400 MG PO TABS: 600.0000 mg | ORAL_TABLET | Freq: Once | ORAL | Status: DC

## 2020-04-02 NOTE — ED Provider Notes (Signed)
[name] Provider Note   CSN: [id number] Arrival date & time: 04/02/20  1547     History Chief Complaint  Patient presents with  . Headache  . Nasal Congestion    Christina Mayer is a 44 y.o. female.  Christina Mayer is a 44 y.o. female with a history of eczema and hypertension who presented today for evaluation of cough, chills, and general malaise x 1 day. Pt stated that last night she began feeling very tired and lethargic, she has had a productive cough for one day, she cannot recall the color of the sputum. Pt endorses chills and body aches. Denies any fevers, nausea, vomiting, abdominal pain, or diarrhea. Pt denies any urinary symptoms. Pt endorses some mild lower abdominal pain earlier this week that has since subsided. She denies any shortness of breath or chest pain at this time. She has not tried anything for her symptoms. Pt stated that her son has been sick for about 4 days, he has not received his covid test results yet. She has been fully vaccinated against covid-19.  Patient also notes that she has been having a worsening eczema flare over the past week, she takes Cosentyx for this but states that her dermatologist has to give her a burst of prednisone when she gets a flare.  She reports large patches of eczema over her back and that it has gotten increasingly severe over her hands.  She has not been able to get into see her dermatologist.        Past Medical History:  Diagnosis Date  . Eczema   . Hypertension     There are no problems to display for this patient.   History reviewed. No pertinent surgical history.   OB History   No obstetric history on file.     No family history on file.  Social History   Tobacco Use  . Smoking status: Current Every Day Smoker  . Smokeless tobacco: Never Used  Substance Use Topics  . Alcohol use: Yes    Comment: occ  . Drug use: No    Home Medications Prior to Admission medications    Medication Sig Start Date End Date Taking? Authorizing Provider  amLODipine (NORVASC) 10 MG tablet Take by mouth. 09/19/18   [provider]  COSENTYX SENSOREADY, 300 MG, 150 MG/ML SOAJ Inject 2 Syringes into the skin every 28 (twenty-eight) days. 05/22/19   [provider]  metroNIDAZOLE (FLAGYL) 500 MG tablet Take 1 tablet (500 mg total) by mouth 2 (two) times daily. One po bid x 7 days 10/01/18   Malvin Johns, MD  naproxen (NAPROSYN) 500 MG tablet Take 1 tablet (500 mg total) by mouth 2 (two) times daily. 05/22/19   Petrucelli, Samantha R, PA-C  ondansetron (ZOFRAN ODT) 4 MG disintegrating tablet Take 1 tablet (4 mg total) by mouth every 8 (eight) hours as needed for nausea or vomiting. 06/09/19   Joy, Shawn C, PA-C  predniSONE (DELTASONE) 20 MG tablet Take 3 tablets (60 mg total) by mouth daily for 5 days. 04/02/20 04/07/20  Jacqlyn Larsen, PA-C  triamcinolone cream (KENALOG) 0.1 % Apply 1 application topically 2 (two) times daily. 12/08/16   Forde Dandy, MD  triamcinolone ointment (KENALOG) 0.1 % SMARTSIG:Topical 1 to 3 Times Daily PRN 01/20/20   [provider]    Allergies    Penicillin g  Review of Systems   Review of Systems  Constitutional: Positive for chills and fatigue. Negative for fever.  Respiratory: Positive for cough. Negative for shortness of breath.   Cardiovascular: Negative for chest pain.  Gastrointestinal: Negative for nausea and vomiting.  Musculoskeletal: Positive for myalgias.  Neurological: Positive for headaches.  All other systems reviewed and are negative.   Physical Exam Updated Vital Signs BP (!) 159/84 (BP Location: Right Arm)   Pulse (!) 59   Temp 98.5 F (36.9 C) (Oral)   Resp 18   Ht 5\' 7"  (1.702 m)   Wt 90.7 kg   LMP 03/17/2020   SpO2 100%   BMI 31.32 kg/m   Physical Exam Vitals and nursing note reviewed.  Constitutional:      General: She is not in acute distress.    Appearance: She is well-developed and normal  weight. She is not ill-appearing or diaphoretic.  HENT:     Head: Normocephalic and atraumatic.  Eyes:     General:        Right eye: No discharge.        Left eye: No discharge.  Neck:     Comments: No rigidity Cardiovascular:     Rate and Rhythm: Normal rate and regular rhythm.     Heart sounds: Normal heart sounds.  Pulmonary:     Effort: Pulmonary effort is normal. No respiratory distress.     Breath sounds: Normal breath sounds.     Comments: Respirations equal and unlabored, patient able to speak in full sentences, lungs clear to auscultation bilaterally Abdominal:     General: Bowel sounds are normal. There is no distension.     Palpations: Abdomen is soft. There is no mass.     Tenderness: There is no abdominal tenderness. There is no guarding.     Comments: Abdomen soft, nondistended, nontender to palpation in all quadrants without guarding or peritoneal signs  Musculoskeletal:        General: No deformity.     Cervical back: Neck supple.  Lymphadenopathy:     Cervical: No cervical adenopathy.  Skin:    General: Skin is warm and dry.     Capillary Refill: Capillary refill takes less than 2 seconds.     Comments: Severe eczema noted to bilateral hands  Neurological:     Mental Status: She is alert and oriented to person, place, and time.  Psychiatric:        Mood and Affect: Mood normal.        Behavior: Behavior normal.     ED Results / Procedures / Treatments   Labs (all labs ordered are listed, but only abnormal results are displayed) Labs Reviewed  SARS CORONAVIRUS 2 BY RT PCR (HOSPITAL ORDER, [place] [facility])    EKG None  Radiology DG Chest Port 1 View  Result Date: 04/02/2020 CLINICAL DATA:  Headache, nasal congestion, cough EXAM: PORTABLE CHEST 1 VIEW COMPARISON:  03/27/2019 FINDINGS: The heart size and mediastinal contours are within normal limits. Both lungs are clear. The visualized skeletal structures are unremarkable.  IMPRESSION: No active disease. Electronically Signed   By: Randa Ngo M.D.   On: 04/02/2020 21:36    Procedures Procedures (including critical care time)  Medications Ordered in ED Medications  ibuprofen (ADVIL) tablet 600 mg (600 mg Oral Given 04/02/20 2137)    ED Course  I have reviewed the triage vital signs and the nursing notes.  Pertinent labs & imaging results that were available during my care of the patient were reviewed by me and considered in my medical decision making (see  chart for details).    MDM Rules/Calculators/A&P                           Patient presents with 2 days of chills, body aches, headache and cough, her son has been sick for the past week with similar symptoms, has a Covid test pending that has not yet returned.  She is afebrile with normal vitals today and is overall well-appearing.  Chest x-ray is clear and Covid test today is negative, although I have counseled the patient that it could be too early in her course for Covid test to be positive and if she is still having symptoms in 3 days she should get retested as an outpatient. She is also having a severe eczema flare on her hands And has not been able to get into see her dermatologist but states that she usually has to be placed on prednisone, will prescribe patient a prednisone burst, but stressed the importance of following up with her dermatologist.  Discussed symptomatic treatment four viral upper respiratory infection and strict return precautions. Pt expresses understanding and agrees with plan.  Christina Mayer was evaluated in Emergency Department on 04/02/2020 for the symptoms described in the history of present illness. She was evaluated in the context of the global COVID-19 pandemic, which necessitated consideration that the patient might be at risk for infection with the SARS-CoV-2 virus that causes COVID-19. Institutional protocols and algorithms that pertain to the evaluation of patients at risk  for COVID-19 are in a state of rapid change based on information released by regulatory bodies including the CDC and federal and state organizations. These policies and algorithms were followed during the patient's care in the ED.  Final Clinical Impression(s) / ED Diagnoses Final diagnoses:  Viral URI with cough  Contact with and (suspected) exposure to covid-19  Eczema, unspecified type    Rx / DC Orders ED Discharge Orders         Ordered    predniSONE (DELTASONE) 20 MG tablet  Daily        04/02/20 2350           Jacqlyn Larsen, PA-C 04/03/20 0001    Truddie Hidden, MD 04/03/20 1035

## 2020-04-02 NOTE — ED Triage Notes (Signed)
Pt reports headache and nasal congestion x 2 days , adds lethargic.

## 2020-04-02 NOTE — Discharge Instructions (Signed)
Your Covid test today was negative, but early on sometimes you can have a false negative, if your symptoms are not improving over the next 2 to 3 days you should have a repeat Covid test done as an outpatient. If second test is negative this is likely another viral infection such as cold or flu. Please continue to quarantine at home and monitor your symptoms closely. You chest x-ray was clear. Antibiotics are not helpful in treating viral infection, the virus should run its course in about 10 days. Please make sure you are drinking plenty of fluids. You can treat your symptoms supportively with tylenol for fevers and pains, and over the counter cough syrups and throat lozenges to help with cough. If your symptoms are not improving please follow up with you Primary doctor.   I recommend that you purchase a home pulse ox to help better monitor your oxygen at home, if you start to have increased work of breathing or shortness of breath or your oxygen drops below 90% please immediately return to the hospital for reevaluation.  If you develop persistent fevers, shortness of breath or difficulty breathing, chest pain, severe headache and neck pain, persistent nausea and vomiting or other new or concerning symptoms return to the Emergency department.

## 2020-08-14 ENCOUNTER — Encounter (HOSPITAL_BASED_OUTPATIENT_CLINIC_OR_DEPARTMENT_OTHER): Payer: Self-pay

## 2020-08-14 ENCOUNTER — Other Ambulatory Visit (HOSPITAL_BASED_OUTPATIENT_CLINIC_OR_DEPARTMENT_OTHER): Payer: Self-pay | Admitting: Physician Assistant

## 2020-08-14 ENCOUNTER — Other Ambulatory Visit: Payer: Self-pay

## 2020-08-14 ENCOUNTER — Emergency Department (HOSPITAL_BASED_OUTPATIENT_CLINIC_OR_DEPARTMENT_OTHER)
Admission: EM | Admit: 2020-08-14 | Discharge: 2020-08-14 | Disposition: A | Discharge status: Home / Self Care | Payer: Medicaid Other | Attending: Emergency Medicine | Admitting: Emergency Medicine

## 2020-08-14 DIAGNOSIS — Z79899 Other long term (current) drug therapy: Secondary | ICD-10-CM | POA: Diagnosis not present

## 2020-08-14 DIAGNOSIS — Z20822 Contact with and (suspected) exposure to covid-19: Secondary | ICD-10-CM

## 2020-08-14 DIAGNOSIS — U071 COVID-19: Secondary | ICD-10-CM | POA: Diagnosis not present

## 2020-08-14 DIAGNOSIS — R509 Fever, unspecified: Secondary | ICD-10-CM | POA: Diagnosis present

## 2020-08-14 DIAGNOSIS — F17200 Nicotine dependence, unspecified, uncomplicated: Secondary | ICD-10-CM | POA: Diagnosis not present

## 2020-08-14 DIAGNOSIS — I10 Essential (primary) hypertension: Secondary | ICD-10-CM | POA: Insufficient documentation

## 2020-08-14 LAB — RAPID INFLUENZA A&B ANTIGENS
Influenza A (ARMC): NEGATIVE
Influenza B (ARMC): NEGATIVE

## 2020-08-14 LAB — SARS CORONAVIRUS 2 (TAT 6-24 HRS): SARS Coronavirus 2: POSITIVE — AB

## 2020-08-14 MED ORDER — ACETAMINOPHEN 325 MG PO TABS
650.0000 mg | ORAL_TABLET | Freq: Once | ORAL | Status: AC | PRN
  Administered 2020-08-14: 650 mg via ORAL
  Filled 2020-08-14: qty 2

## 2020-08-14 MED ORDER — BENZONATATE 100 MG PO CAPS: 100.0000 mg | ORAL_CAPSULE | Freq: Three times a day (TID) | ORAL | 0 refills | Status: DC

## 2020-08-14 MED ORDER — FLUTICASONE PROPIONATE 50 MCG/ACT NA SUSP: 2.0000 | Freq: Every day | NASAL | 0 refills | Status: DC

## 2020-08-14 MED FILL — BENZONATATE 100 MG CAPS: 100 | 5 days supply | Qty: 15 | Fill #0

## 2020-08-14 NOTE — ED Triage Notes (Signed)
Pt arrives with c/o fever, joint pain and body ahces with headaches. States that she did have a coworker that had covid about a week ago her symptoms have been on going for a couple of days reports someone in her home is also sick. Pt reports her last tylenol was around 4 or 5 this morning states that she only took one tylenol at that time.

## 2020-08-14 NOTE — ED Provider Notes (Signed)
[facility] DE[facility] Provider Note   CSN: [id number] Arrival date & time: 08/14/20  1041     History Chief Complaint  Patient presents with  . Covid Exposure    Christina Mayer is a 45 y.o. female.  HPI   Pt is a 45 y/o female who presents to the Ed today for eval of fever, body aches, chills/sweats for 3 days. Further reports rhinorrhea, congestion, cough, and a headache. Denies vomiting or diarrhea. Denies any other symptoms. Has been exposed to [place]. She reports she has been vaccinated with pfizer vaccine. Has not had booster.   Past Medical History:  Diagnosis Date  . Eczema   . Hypertension     There are no problems to display for this patient.   History reviewed. No pertinent surgical history.   OB History   No obstetric history on file.     No family history on file.  Social History   Tobacco Use  . Smoking status: Current Every Day Smoker  . Smokeless tobacco: Never Used  Substance Use Topics  . Alcohol use: Yes    Comment: occ  . Drug use: No    Home Medications Prior to Admission medications   Medication Sig Start Date End Date Taking? Authorizing Provider  benzonatate (TESSALON) 100 MG capsule Take 1 capsule (100 mg total) by mouth every 8 (eight) hours for 5 days. 08/14/20 08/19/20 Yes Kimari Lienhard S, PA-C  fluticasone (FLONASE) 50 MCG/ACT nasal spray Place 2 sprays into both nostrils daily. 08/14/20  Yes Jaclin Finks S, PA-C  hydrOXYzine (ATARAX/VISTARIL) 25 MG tablet TAKE 1 TABLET BY MOUTH EVERY 8 HOURS AS NEEDED FOR UP TO 10 DAYS 09/19/18  Yes [provider]  amLODipine (NORVASC) 10 MG tablet Take by mouth. 09/19/18   [provider]  COSENTYX SENSOREADY, 300 MG, 150 MG/ML SOAJ Inject 2 Syringes into the skin every 28 (twenty-eight) days. 05/22/19   [provider]  metroNIDAZOLE (FLAGYL) 500 MG tablet Take 1 tablet (500 mg total) by mouth 2 (two) times daily. One po bid x 7 days 10/01/18   Malvin Johns, MD  naproxen (NAPROSYN) 500 MG tablet Take 1 tablet (500 mg total) by mouth 2 (two) times daily. 05/22/19   Petrucelli, Samantha R, PA-C  ondansetron (ZOFRAN ODT) 4 MG disintegrating tablet Take 1 tablet (4 mg total) by mouth every 8 (eight) hours as needed for nausea or vomiting. 06/09/19   Joy, Shawn C, PA-C  triamcinolone cream (KENALOG) 0.1 % Apply 1 application topically 2 (two) times daily. 12/08/16   Forde Dandy, MD  triamcinolone ointment (KENALOG) 0.1 % SMARTSIG:Topical 1 to 3 Times Daily PRN 01/20/20   [provider]    Allergies    Penicillin g  Review of Systems   Review of Systems  Constitutional: Positive for chills, diaphoresis and fever.  HENT: Positive for congestion and rhinorrhea. Negative for ear pain and sore throat.   Eyes: Negative for pain and visual disturbance.  Respiratory: Positive for cough. Negative for shortness of breath.   Cardiovascular: Negative for chest pain and palpitations.  Gastrointestinal: Negative for abdominal pain, constipation, diarrhea, nausea and vomiting.  Genitourinary: Negative for dysuria and hematuria.  Musculoskeletal: Positive for myalgias. Negative for back pain.  Skin: Negative for rash.  Neurological: Positive for headaches.  All other systems reviewed and are negative.   Physical Exam Updated Vital Signs BP 123/80 (BP Location: Left Arm)   Pulse 72   Temp (!) 100.4 F (38  C) (Oral)   Resp 18   Ht 5\' 6"  (1.676 m)   Wt 90.7 kg   LMP 07/23/2020   SpO2 100%   BMI 32.28 kg/m   Physical Exam Vitals and nursing note reviewed.  Constitutional:      General: She is not in acute distress.    Appearance: She is well-developed and well-nourished.  HENT:     Head: Normocephalic and atraumatic.  Eyes:     Conjunctiva/sclera: Conjunctivae normal.  Cardiovascular:     Rate and Rhythm: Normal rate and regular rhythm.     Heart sounds: Normal heart sounds. No murmur heard.   Pulmonary:     Effort:  Pulmonary effort is normal. No respiratory distress.     Breath sounds: Normal breath sounds. No wheezing, rhonchi or rales.  Abdominal:     General: Bowel sounds are normal.     Palpations: Abdomen is soft.     Tenderness: There is no abdominal tenderness. There is no guarding or rebound.  Musculoskeletal:        General: No edema.     Cervical back: Neck supple.  Skin:    General: Skin is warm and dry.  Neurological:     Mental Status: She is alert.  Psychiatric:        Mood and Affect: Mood and affect normal.     ED Results / Procedures / Treatments   Labs (all labs ordered are listed, but only abnormal results are displayed) Labs Reviewed  SARS CORONAVIRUS 2 (TAT 6-24 HRS)  RAPID INFLUENZA A&B ANTIGENS    EKG None  Radiology No results found.  Procedures Procedures   Medications Ordered in ED Medications  acetaminophen (TYLENOL) tablet 650 mg (650 mg Oral Given 08/14/20 1102)    ED Course  I have reviewed the triage vital signs and the nursing notes.  Pertinent labs & imaging results that were available during my care of the patient were reviewed by me and considered in my medical decision making (see chart for details).    MDM Rules/Calculators/A&P                          Patient presenting for evaluation for Covid.  Reports symptoms ongoing for several days.  Patient nontoxic, well-appearing, no distress.  Vital signs are reassuring.   Tested for Covid in the ED. She is also requesting an influenza test. I explained that she is out of the window for tamiflu and the results of the influenza test would not change management. She expresses understanding. Results pending upon discharge. Pt will f/u on mychart. Advised on quarantine measures. Will give Rx for symptomatic management. Advised on f/u and return precautions. Pt voiced understanding of the plan and reasons to return. All questions answered, pt stable for d/c.  Christina Mayer was evaluated in Emergency  Department on 08/14/2020 for the symptoms described in the history of present illness. She was evaluated in the context of the global COVID-19 pandemic, which necessitated consideration that the patient might be at risk for infection with the SARS-CoV-2 virus that causes COVID-19. Institutional protocols and algorithms that pertain to the evaluation of patients at risk for COVID-19 are in a state of rapid change based on information released by regulatory bodies including the CDC and federal and state organizations. These policies and algorithms were followed during the patient's care in the ED.'   Final Clinical Impression(s) / ED Diagnoses Final diagnoses:  Suspected COVID-19 virus infection  Rx / DC Orders ED Discharge Orders         Ordered    benzonatate (TESSALON) 100 MG capsule  Every 8 hours        08/14/20 1311    fluticasone (FLONASE) 50 MCG/ACT nasal spray  Daily        08/14/20 [address] [place], PA-C 08/14/20 1311    Gareth Morgan, MD 08/16/20 1115

## 2020-08-14 NOTE — Discharge Instructions (Signed)
Take medications as directed.   If your covid test is positive, you should be isolated for at least 7 days since the onset of your symptoms AND >72 hours after symptoms resolution (absence of fever without the use of fever reducing medication and improvement in respiratory symptoms), whichever is longer  Please follow up with your primary care provider within 5-7 days for re-evaluation of your symptoms. If you do not have a primary care provider, information for a healthcare clinic has been provided for you to make arrangements for follow up care. Please return to the emergency department for any new or worsening symptoms including shortness of breath or chest pain.

## 2020-11-02 IMAGING — DX DG CHEST 1V PORT
1 series · 1 of 1 positions shown · non-contrast
Comparison: 03/27/2019

CLINICAL DATA: Headache, nasal congestion, cough

EXAM:
PORTABLE CHEST 1 VIEW

[chest ap]
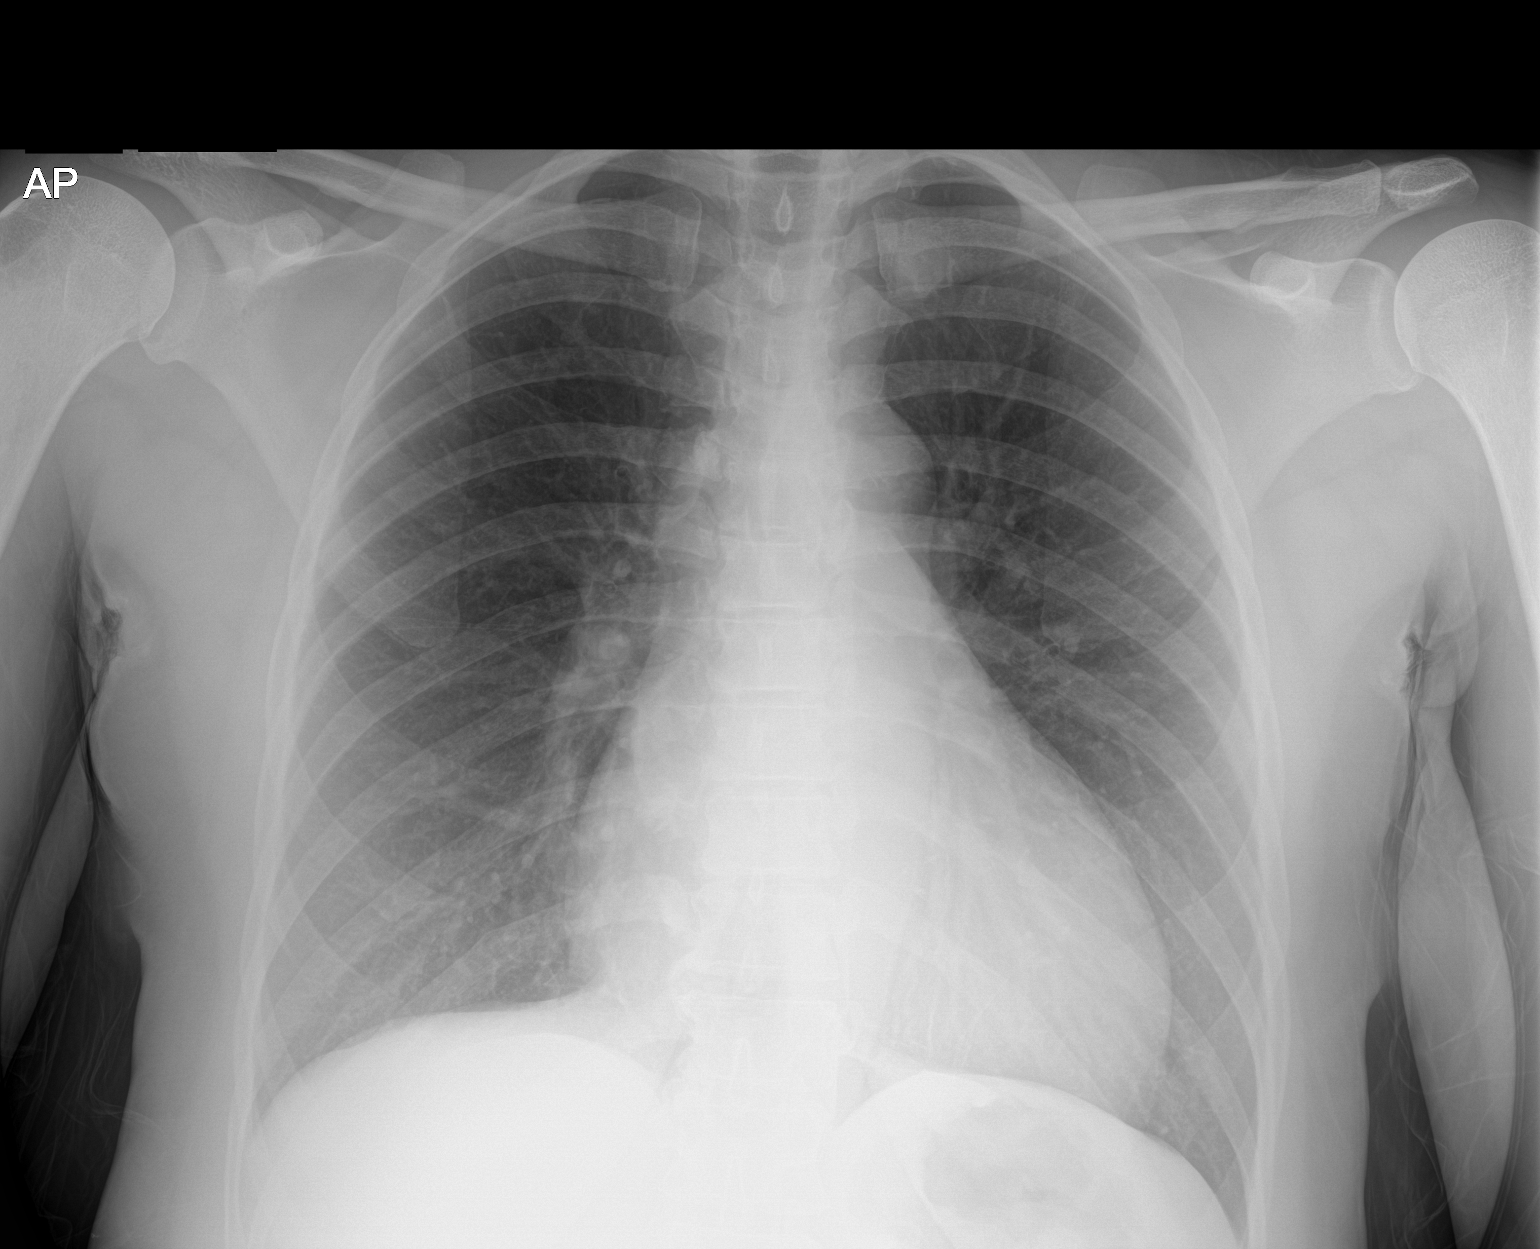

[1 of 1 positions shown; findings below may reference images not displayed]

FINDINGS: The heart size and mediastinal contours are within normal limits.
Both lungs are clear. The visualized skeletal structures are
unremarkable.
IMPRESSION: No active disease.

## 2021-08-27 ENCOUNTER — Emergency Department (HOSPITAL_BASED_OUTPATIENT_CLINIC_OR_DEPARTMENT_OTHER): Payer: Medicaid Other

## 2021-08-27 ENCOUNTER — Encounter (HOSPITAL_BASED_OUTPATIENT_CLINIC_OR_DEPARTMENT_OTHER): Payer: Self-pay | Admitting: Emergency Medicine

## 2021-08-27 ENCOUNTER — Emergency Department (HOSPITAL_BASED_OUTPATIENT_CLINIC_OR_DEPARTMENT_OTHER)
Admission: EM | Admit: 2021-08-27 | Discharge: 2021-08-27 | Disposition: A | Discharge status: Home / Self Care | Payer: Medicaid Other | Attending: Emergency Medicine | Admitting: Emergency Medicine

## 2021-08-27 ENCOUNTER — Other Ambulatory Visit: Payer: Self-pay

## 2021-08-27 DIAGNOSIS — R102 Pelvic and perineal pain: Secondary | ICD-10-CM | POA: Insufficient documentation

## 2021-08-27 DIAGNOSIS — R52 Pain, unspecified: Secondary | ICD-10-CM

## 2021-08-27 DIAGNOSIS — N938 Other specified abnormal uterine and vaginal bleeding: Secondary | ICD-10-CM

## 2021-08-27 DIAGNOSIS — N920 Excessive and frequent menstruation with regular cycle: Secondary | ICD-10-CM | POA: Diagnosis not present

## 2021-08-27 DIAGNOSIS — R1032 Left lower quadrant pain: Secondary | ICD-10-CM | POA: Insufficient documentation

## 2021-08-27 DIAGNOSIS — N939 Abnormal uterine and vaginal bleeding, unspecified: Secondary | ICD-10-CM | POA: Diagnosis present

## 2021-08-27 LAB — WET PREP, GENITAL
Clue Cells Wet Prep HPF POC: NONE SEEN
Sperm: NONE SEEN
Trich, Wet Prep: NONE SEEN
WBC, Wet Prep HPF POC: <10 (ref <10)
Yeast Wet Prep HPF POC: NONE SEEN

## 2021-08-27 LAB — URINALYSIS, ROUTINE W REFLEX MICROSCOPIC
Bilirubin Urine: NEGATIVE
Glucose, UA: NEGATIVE mg/dL
Ketones, ur: NEGATIVE mg/dL
Leukocytes,Ua: NEGATIVE
Nitrite: NEGATIVE
Protein, ur: NEGATIVE mg/dL
Specific Gravity, Urine: 1.015 (ref 1.005–1.030)
pH: 7 (ref 5.0–8.0)

## 2021-08-27 LAB — PREGNANCY, URINE: Preg Test, Ur: NEGATIVE

## 2021-08-27 LAB — URINALYSIS, MICROSCOPIC (REFLEX): RBC / HPF: >50 RBC/hpf (ref 0–5)

## 2021-08-27 MED ORDER — KETOROLAC TROMETHAMINE 15 MG/ML IJ SOLN
15.0000 mg | Freq: Once | INTRAMUSCULAR | Status: AC
Start: 2021-08-27 — End: 2021-08-27
  Administered 2021-08-27: 15 mg via INTRAMUSCULAR
  Filled 2021-08-27: qty 1

## 2021-08-27 NOTE — ED Triage Notes (Signed)
Pt reports starting period yesterday and today having LLQ abd pain and passing clots.

## 2021-08-27 NOTE — ED Provider Notes (Signed)
[facility] Provider Note   CSN: [id number] Arrival date & time: 08/27/21  1829     History  Chief Complaint  Patient presents with   Menstrual Problem    Christina Mayer is a 46 y.o. female.  46 yo F with a chief complaint of heavy menstrual cycle and pain to the left lower side of her abdomen.  Started having her menstrual cycle yesterday and then this morning woke up to use the bathroom and felt she passed a clot.  Has been having aching pain to the left adnexal region.  Denies pain like that typically with her menstrual cycle.  Denies likelihood of being pregnant.  Denies vaginal discharge.  Denies urinary symptoms.       Home Medications Prior to Admission medications   Medication Sig Start Date End Date Taking? Authorizing Provider  amLODipine (NORVASC) 10 MG tablet Take by mouth. 09/19/18   [provider]  COSENTYX SENSOREADY, 300 MG, 150 MG/ML SOAJ Inject 2 Syringes into the skin every 28 (twenty-eight) days. 05/22/19   [provider]  fluticasone (FLONASE) 50 MCG/ACT nasal spray PLACE 2 SPRAYS INTO BOTH NOSTRILS DAILY. 08/14/20 08/14/21  Couture, Cortni S, PA-C  hydrOXYzine (ATARAX/VISTARIL) 25 MG tablet TAKE 1 TABLET BY MOUTH EVERY 8 HOURS AS NEEDED FOR UP TO 10 DAYS 09/19/18   [provider]  metroNIDAZOLE (FLAGYL) 500 MG tablet Take 1 tablet (500 mg total) by mouth 2 (two) times daily. One po bid x 7 days 10/01/18   Malvin Johns, MD  naproxen (NAPROSYN) 500 MG tablet Take 1 tablet (500 mg total) by mouth 2 (two) times daily. 05/22/19   Petrucelli, Samantha R, PA-C  ondansetron (ZOFRAN ODT) 4 MG disintegrating tablet Take 1 tablet (4 mg total) by mouth every 8 (eight) hours as needed for nausea or vomiting. 06/09/19   Joy, Shawn C, PA-C  triamcinolone cream (KENALOG) 0.1 % Apply 1 application topically 2 (two) times daily. 12/08/16   Forde Dandy, MD  triamcinolone ointment (KENALOG) 0.1 % SMARTSIG:Topical 1 to 3 Times  Daily PRN 01/20/20   [provider]      Allergies    Penicillin g    Review of Systems   Review of Systems  Physical Exam Updated Vital Signs BP 126/85    Pulse (!) 57    Temp 98.1 F (36.7 C) (Oral)    Resp 16    Ht 5' 6.5" (1.689 m)    Wt 90.7 kg    SpO2 100%    BMI 31.80 kg/m  Physical Exam Vitals and nursing note reviewed.  Constitutional:      General: She is not in acute distress.    Appearance: She is well-developed. She is not diaphoretic.  HENT:     Head: Normocephalic and atraumatic.  Eyes:     Pupils: Pupils are equal, round, and reactive to light.  Cardiovascular:     Rate and Rhythm: Normal rate and regular rhythm.     Heart sounds: No murmur heard.   No friction rub. No gallop.  Pulmonary:     Effort: Pulmonary effort is normal.     Breath sounds: No wheezing or rales.  Abdominal:     General: There is no distension.     Palpations: Abdomen is soft.     Tenderness: There is abdominal tenderness.     Comments: Mild tenderness on the left side well down in the abdomen just above the inguinal ligament.  Musculoskeletal:  General: No tenderness.     Cervical back: Normal range of motion and neck supple.  Skin:    General: Skin is warm and dry.  Neurological:     Mental Status: She is alert and oriented to person, place, and time.  Psychiatric:        Behavior: Behavior normal.    ED Results / Procedures / Treatments   Labs (all labs ordered are listed, but only abnormal results are displayed) Labs Reviewed  URINALYSIS, ROUTINE W REFLEX MICROSCOPIC - Abnormal; Notable for the following components:      Result Value   APPearance HAZY (*)    Hgb urine dipstick LARGE (*)    All other components within normal limits  URINALYSIS, MICROSCOPIC (REFLEX) - Abnormal; Notable for the following components:   Bacteria, UA RARE (*)    All other components within normal limits  WET PREP, GENITAL  PREGNANCY, URINE  GC/CHLAMYDIA PROBE AMP ([facility]) NOT AT [facility]    EKG None  Radiology US PELVIC COMPLETE W TRANSVAGINAL AND TORSION R/O  Result Date: 08/27/2021 CLINICAL DATA:  Left adnexal pain with bleeding.  LMP 08/26/2021. EXAM: TRANSABDOMINAL AND TRANSVAGINAL ULTRASOUND OF PELVIS DOPPLER ULTRASOUND OF OVARIES TECHNIQUE: Both transabdominal and transvaginal ultrasound examinations of the pelvis were performed. Transabdominal technique was performed for global imaging of the pelvis including uterus, ovaries, adnexal regions, and pelvic cul-de-sac. It was necessary to proceed with endovaginal exam following the transabdominal exam to visualize the endometrium and ovaries to better advantage. Color and duplex Doppler ultrasound was utilized to evaluate blood flow to the ovaries. COMPARISON:  Pelvic ultrasound 10/01/2018. FINDINGS: Uterus Measurements: 8.5 x 4.1 x 4.4 cm = volume: 83.1 mL. No fibroids or other mass visualized. Endometrium Thickness: 7 mm.  No focal abnormality visualized. Right ovary Measurements: Not visualized.  No evidence of adnexal mass. Left ovary Measurements: 3.4 x 1.4 x 2.7 cm = volume: 6.7 ML. Normal appearance/no adnexal mass. Pulsed Doppler evaluation of both ovaries demonstrates normal low-resistance arterial and venous waveforms. Other findings No abnormal free fluid. IMPRESSION: 1. No acute findings. The left ovary appears normal. The right ovary was not visualized. 2. The uterus appears unremarkable. Electronically Signed   By: Richardean Sale M.D.   On: 08/27/2021 10:24    Procedures Procedures    Medications Ordered in ED Medications  ketorolac (TORADOL) 15 MG/ML injection 15 mg (15 mg Intramuscular Given 08/27/21 3762)    ED Course/ Medical Decision Making/ A&P                           Medical Decision Making Amount and/or Complexity of Data Reviewed Labs: ordered. Radiology: ordered. ECG/medicine tests: ordered.  Risk Prescription drug management.   Patient is a 46 y.o. female with a cc of  vaginal bleeding and left adnexal pain.  Going on for a day.  Will obtain pregnancy test UA pelvic exam..  I was concerned for the possibility of ovarian torsion and so pursed a further workup. Significant PMH of psoriatic arthritis on the dupixant  on record review.   I independently interpreted the patients labs and imaging UA negative for infection.  Pregnancy test negative.  Ultrasound unremarkable.  Will discharge home.  PCP and GYN follow-up.  12:27 PM:  I have discussed the diagnosis/risks/treatment options with the patient.  Evaluation and diagnostic testing in the emergency department does not suggest an emergent condition requiring admission or immediate intervention beyond what has been performed  at this time.  They will follow up with  PCP. We also discussed returning to the ED immediately if new or worsening sx occur. We discussed the sx which are most concerning (e.g., sudden worsening pain, fever, inability to tolerate by mouth ) that necessitate immediate return. Medications administered to the patient during their visit and any new prescriptions provided to the patient are listed below.  Medications given during this visit Medications  ketorolac (TORADOL) 15 MG/ML injection 15 mg (15 mg Intramuscular Given 08/27/21 4827)     The patient appears reasonably screen and/or stabilized for discharge and I doubt any other medical condition or other [facility] requiring further screening, evaluation, or treatment in the ED at this time prior to discharge.          Final Clinical Impression(s) / ED Diagnoses Final diagnoses:  DUB (dysfunctional uterine bleeding)    Rx / DC Orders ED Discharge Orders     None         Deno Etienne, DO 08/27/21 1227

## 2021-08-27 NOTE — ED Notes (Signed)
Pt transported to US

## 2021-08-27 NOTE — Discharge Instructions (Signed)
Your ultrasound was normal.  You are not pregnant.  No urinary tract infection.  Please follow-up with your family doctor and OB in the office.  Tylenol and ibuprofen tend to work best for this count of discomfort.  Return for worsening bleeding if you feel you are going to pass out.

## 2021-08-30 LAB — GC/CHLAMYDIA PROBE AMP (~~LOC~~) NOT AT ARMC
Chlamydia: NEGATIVE
Comment: NEGATIVE
Comment: NORMAL
Neisseria Gonorrhea: NEGATIVE

## 2022-03-29 IMAGING — US US PELVIS COMPLETE TRANSABD/TRANSVAG W DUPLEX AND/OR DOPPLER
1 series · 13 of 25 positions shown · non-contrast
Comparison: Pelvic ultrasound 10/01/2018.

CLINICAL DATA: Left adnexal pain with bleeding.  LMP 08/26/2021.

EXAM:
TRANSABDOMINAL AND TRANSVAGINAL ULTRASOUND OF PELVIS
DOPPLER ULTRASOUND OF OVARIES
TECHNIQUE: Both transabdominal and transvaginal ultrasound examinations of the
pelvis were performed. Transabdominal technique was performed for
global imaging of the pelvis including uterus, ovaries, adnexal
regions, and pelvic cul-de-sac.
It was necessary to proceed with endovaginal exam following the
transabdominal exam to visualize the endometrium and ovaries to
better advantage. Color and duplex Doppler ultrasound was utilized
to evaluate blood flow to the ovaries.

[Series 1: us pelvis complete transabd/transvag w duplex and/ · 13 of 93 slices shown]
[im 1/93]
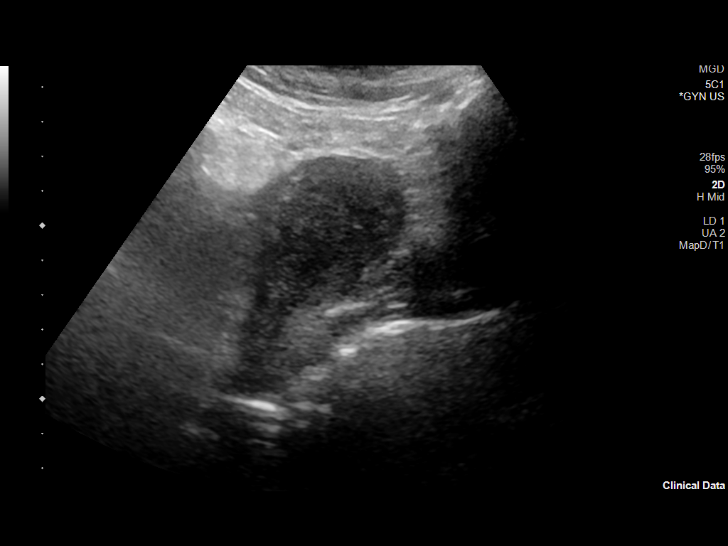
[im 8/93]
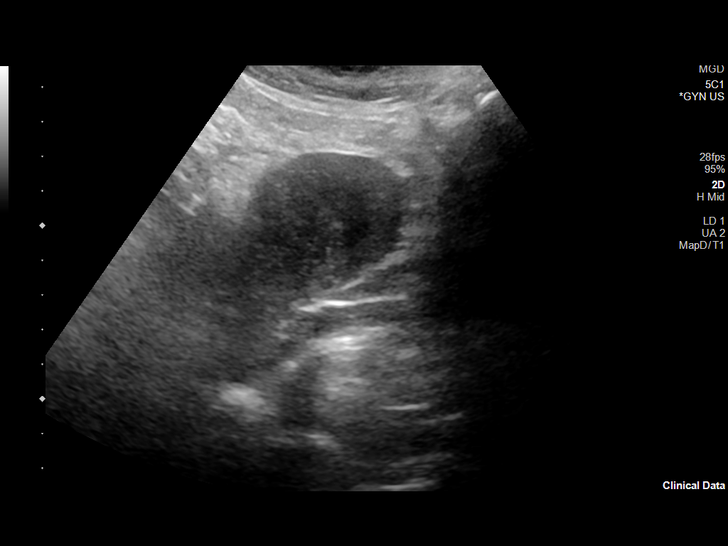
[im 16/93]
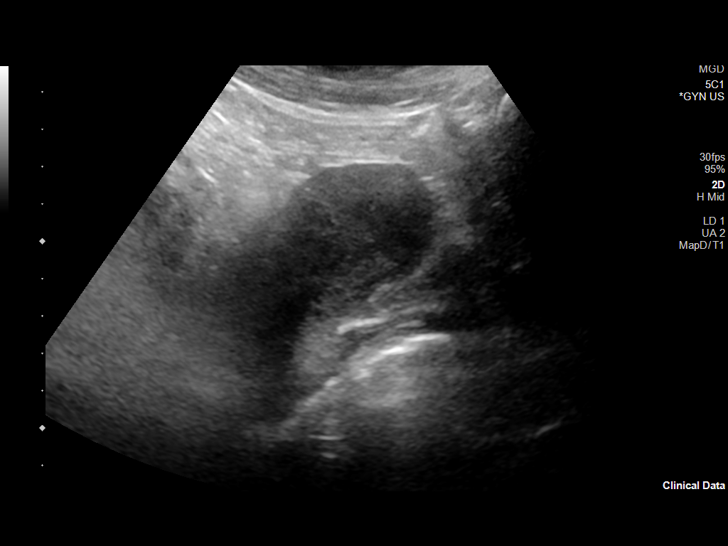
[im 24/93]
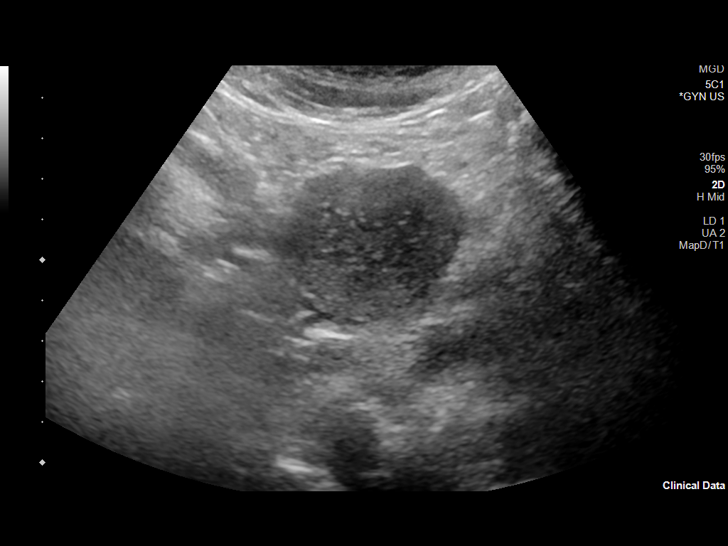
[im 31/93]
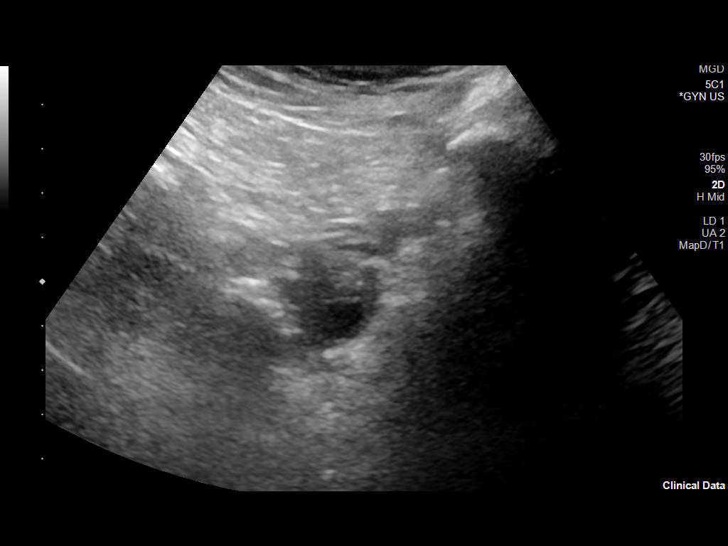
[im 39/93]
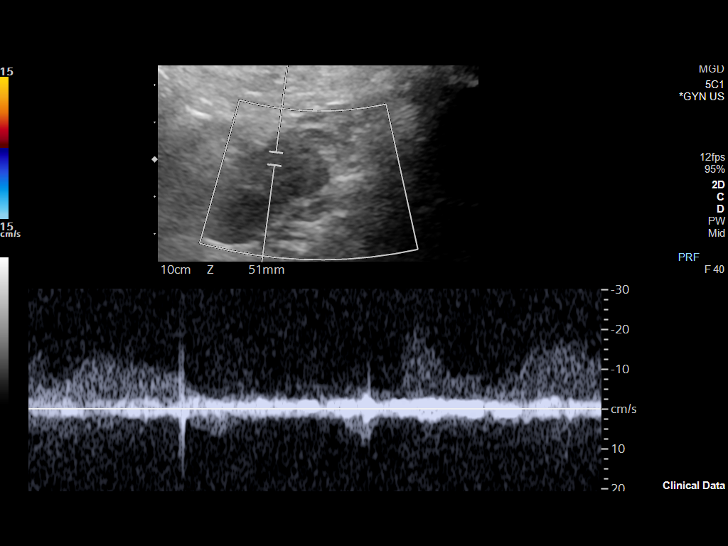
[im 47/93]
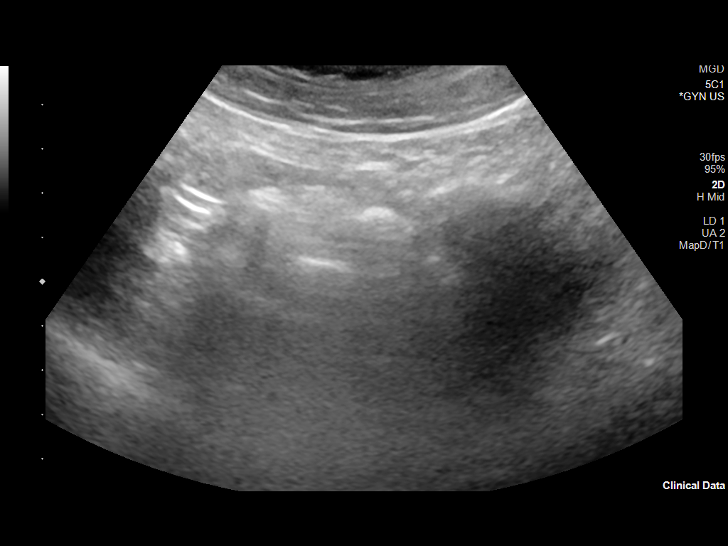
[im 54/93]
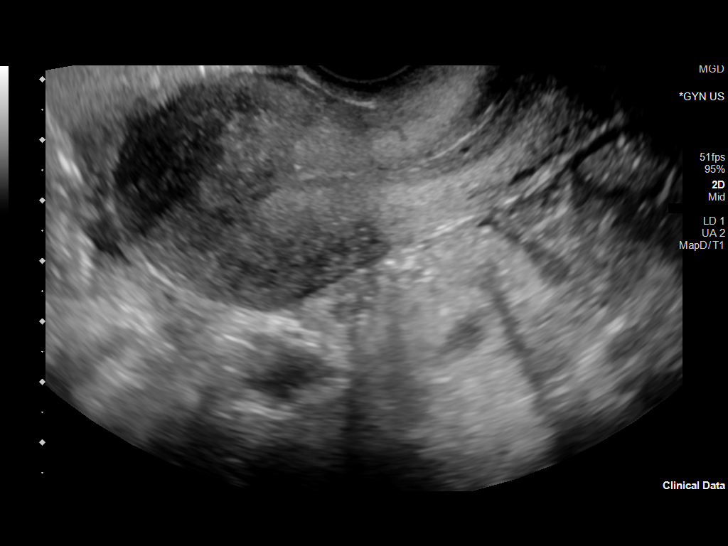
[im 62/93]
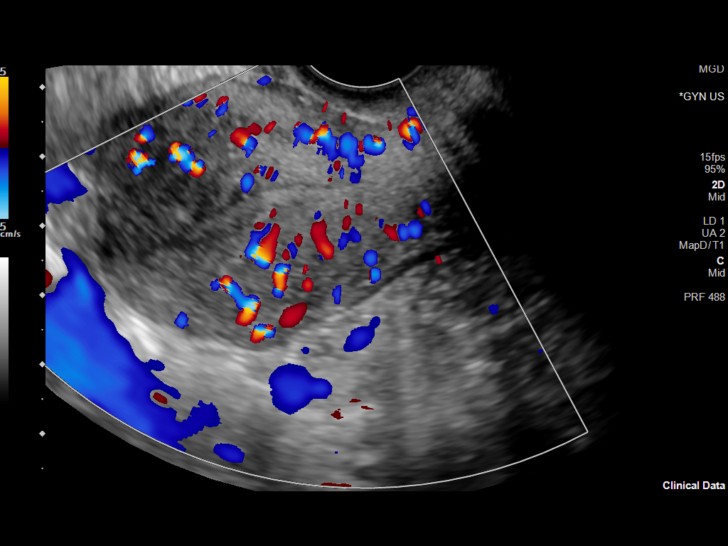
[im 70/93]
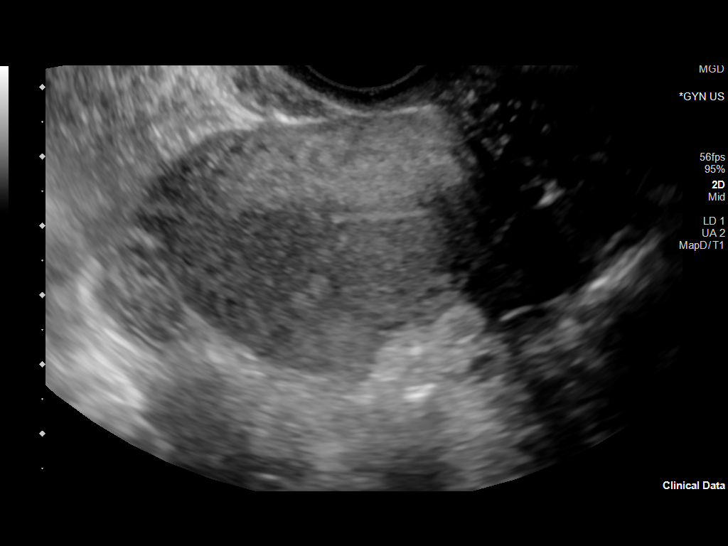
[im 77/93]
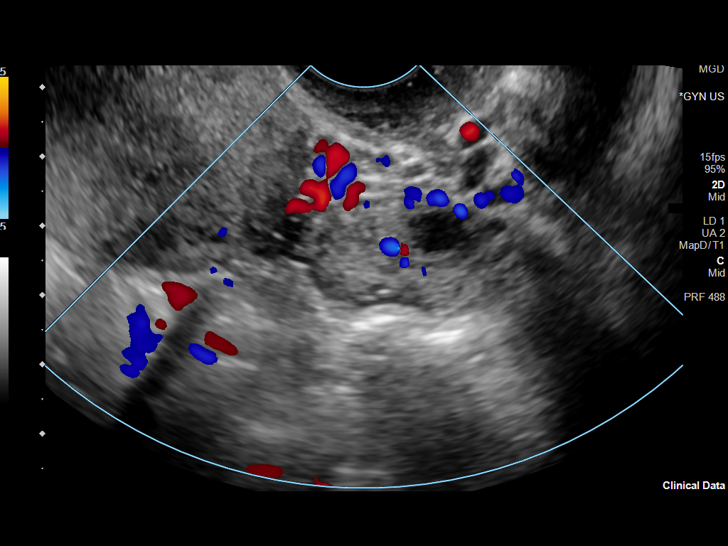
[im 85/93]
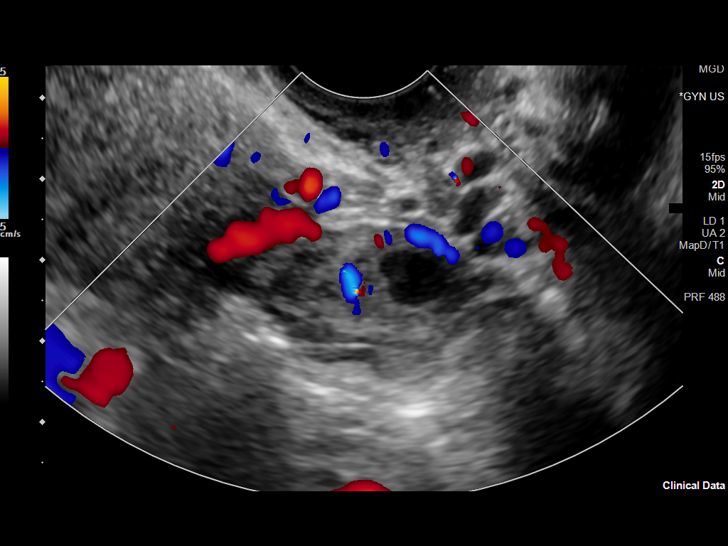
[im 93/93]
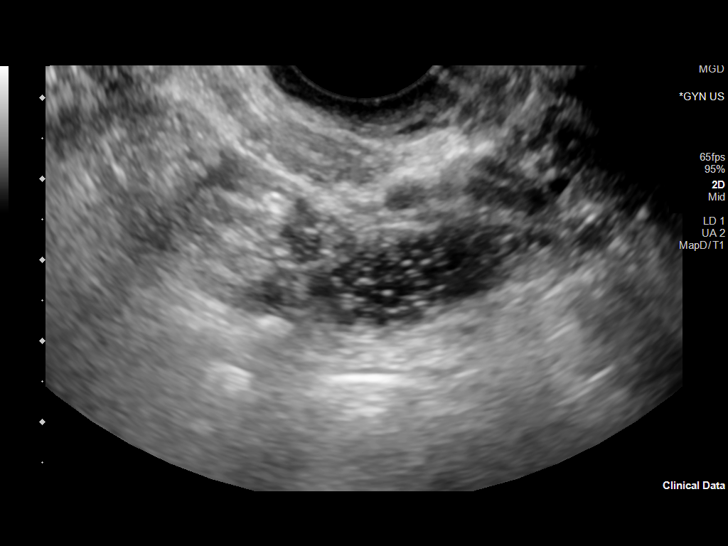

[13 of 25 positions shown; findings below may reference images not displayed]

FINDINGS: Uterus

Measurements: 8.5 x 4.1 x 4.4 cm = volume: 83.1 mL. No fibroids or
other mass visualized.

Endometrium

Thickness: 7 mm.  No focal abnormality visualized.

Right ovary

Measurements: Not visualized.  No evidence of adnexal mass.

Left ovary

Measurements: 3.4 x 1.4 x 2.7 cm = volume: 6.7 ML. Normal
appearance/no adnexal mass.

Pulsed Doppler evaluation of both ovaries demonstrates normal
low-resistance arterial and venous waveforms.

Other findings

No abnormal free fluid.
IMPRESSION: 1. No acute findings. The left ovary appears normal. The right ovary
was not visualized.
2. The uterus appears unremarkable.

## 2022-12-12 ENCOUNTER — Emergency Department (HOSPITAL_BASED_OUTPATIENT_CLINIC_OR_DEPARTMENT_OTHER)
Admission: EM | Admit: 2022-12-12 | Discharge: 2022-12-12 | Disposition: A | Discharge status: Home / Self Care | Payer: Commercial Managed Care - PPO | Attending: Emergency Medicine | Admitting: Emergency Medicine

## 2022-12-12 ENCOUNTER — Other Ambulatory Visit: Payer: Self-pay

## 2022-12-12 ENCOUNTER — Emergency Department (HOSPITAL_BASED_OUTPATIENT_CLINIC_OR_DEPARTMENT_OTHER): Payer: Commercial Managed Care - PPO

## 2022-12-12 DIAGNOSIS — B349 Viral infection, unspecified: Secondary | ICD-10-CM | POA: Insufficient documentation

## 2022-12-12 DIAGNOSIS — R42 Dizziness and giddiness: Secondary | ICD-10-CM

## 2022-12-12 DIAGNOSIS — K529 Noninfective gastroenteritis and colitis, unspecified: Secondary | ICD-10-CM | POA: Insufficient documentation

## 2022-12-12 DIAGNOSIS — Z20822 Contact with and (suspected) exposure to covid-19: Secondary | ICD-10-CM | POA: Insufficient documentation

## 2022-12-12 DIAGNOSIS — R5381 Other malaise: Secondary | ICD-10-CM | POA: Diagnosis present

## 2022-12-12 LAB — COMPREHENSIVE METABOLIC PANEL
ALT: 12 U/L (ref 0–44)
AST: 21 U/L (ref 15–41)
Albumin: 3.5 g/dL (ref 3.5–5.0)
Alkaline Phosphatase: 43 U/L (ref 38–126)
Anion gap: 6 (ref 5–15)
BUN: 14 mg/dL (ref 6–20)
CO2: 25 mmol/L (ref 22–32)
Calcium: 8.7 mg/dL — ABNORMAL LOW (ref 8.9–10.3)
Chloride: 106 mmol/L (ref 98–111)
Creatinine, Ser: 0.75 mg/dL (ref 0.44–1.00)
GFR, Estimated: >60 mL/min (ref >60)
Glucose, Bld: 111 mg/dL — ABNORMAL HIGH (ref 70–99)
Potassium: 3.4 mmol/L — ABNORMAL LOW (ref 3.5–5.1)
Sodium: 137 mmol/L (ref 135–145)
Total Bilirubin: 0.7 mg/dL (ref 0.3–1.2)
Total Protein: 6.6 g/dL (ref 6.5–8.1)

## 2022-12-12 LAB — CBC WITH DIFFERENTIAL/PLATELET
Abs Immature Granulocytes: 0.02 10*3/uL (ref 0.00–0.07)
Basophils Absolute: 0.1 10*3/uL (ref 0.0–0.1)
Basophils Relative: 1 %
Eosinophils Absolute: 0.4 10*3/uL (ref 0.0–0.5)
Eosinophils Relative: 6 %
HCT: 36.3 % (ref 36.0–46.0)
Hemoglobin: 12.4 g/dL (ref 12.0–15.0)
Immature Granulocytes: 0 %
Lymphocytes Relative: 29 %
Lymphs Abs: 1.7 10*3/uL (ref 0.7–4.0)
MCH: 29.2 pg (ref 26.0–34.0)
MCHC: 34.2 g/dL (ref 30.0–36.0)
MCV: 85.6 fL (ref 80.0–100.0)
Monocytes Absolute: 0.7 10*3/uL (ref 0.1–1.0)
Monocytes Relative: 12 %
Neutro Abs: 3.1 10*3/uL (ref 1.7–7.7)
Neutrophils Relative %: 52 %
Platelets: 276 10*3/uL (ref 150–400)
RBC: 4.24 MIL/uL (ref 3.87–5.11)
RDW: 13.2 % (ref 11.5–15.5)
WBC: 6 10*3/uL (ref 4.0–10.5)
nRBC: 0 % (ref 0.0–0.2)

## 2022-12-12 LAB — TROPONIN I (HIGH SENSITIVITY): Troponin I (High Sensitivity): 7 ng/L (ref <18)

## 2022-12-12 LAB — RESP PANEL BY RT-PCR (RSV, FLU A&B, COVID)  RVPGX2
Influenza A by PCR: NEGATIVE
Influenza B by PCR: NEGATIVE
Resp Syncytial Virus by PCR: NEGATIVE
SARS Coronavirus 2 by RT PCR: NEGATIVE

## 2022-12-12 LAB — LIPASE, BLOOD: Lipase: 36 U/L (ref 11–51)

## 2022-12-12 LAB — HCG, QUANTITATIVE, PREGNANCY: hCG, Beta Chain, Quant, S: <1 m[IU]/mL (ref <5)

## 2022-12-12 LAB — D-DIMER, QUANTITATIVE: D-Dimer, Quant: 0.28 ug/mL-FEU (ref 0.00–0.50)

## 2022-12-12 MED ORDER — ACETAMINOPHEN 500 MG PO TABS
1000.0000 mg | ORAL_TABLET | Freq: Once | ORAL | Status: AC
  Administered 2022-12-12: 1000 mg via ORAL
  Filled 2022-12-12: qty 2

## 2022-12-12 MED ORDER — LACTATED RINGERS IV BOLUS
1000.0000 mL | Freq: Once | INTRAVENOUS | Status: AC
  Administered 2022-12-12: 1000 mL via INTRAVENOUS

## 2022-12-12 MED ORDER — POTASSIUM CHLORIDE CRYS ER 20 MEQ PO TBCR
20.0000 meq | EXTENDED_RELEASE_TABLET | Freq: Once | ORAL | Status: AC
  Administered 2022-12-12: 20 meq via ORAL
  Filled 2022-12-12: qty 1

## 2022-12-12 MED ORDER — ONDANSETRON 4 MG PO TBDP: 4.0000 mg | ORAL_TABLET | Freq: Three times a day (TID) | ORAL | 0 refills | Status: DC | PRN

## 2022-12-12 NOTE — ED Notes (Signed)
Discharge instructions reviewed with patient. Patient verbalizes understanding, no further questions at this time. Medications/prescriptions and follow up information provided. No acute distress noted at time of departure.  

## 2022-12-12 NOTE — ED Triage Notes (Signed)
Pt with dizziness since Saturday; sts worse when she is up walking; c/o body aches and general weakness, chills, NVD yesterday

## 2022-12-12 NOTE — Discharge Instructions (Addendum)
Your labs and imaging were reassuring. Recommend continuing to push oral fluid rehydration with electrolyte containing liquids like gatorade and Pedialyte.

## 2022-12-12 NOTE — ED Provider Notes (Signed)
[facility] Provider Note   CSN: [id number] Arrival date & time: 12/12/22  1520     History  Chief Complaint  Patient presents with   Dizziness    Christina Mayer is a 47 y.o. female.   Dizziness Associated symptoms: diarrhea, nausea and vomiting      47 year old female presenting to the emergency department with malaise.  The patient states that she has had symptoms since this past Saturday.  She endorses generalized malaise, body aches, weakness, chills, additionally she had nausea and vomiting and loose stools yesterday.  She had NBNB emesis twice last night.  She feels lightheaded appetite decreased oral intake.  She denies any numbness, weakness focally, facial droop, dysarthria or dysphagia.  She is tolerating oral intake.  She denies any chest pain or shortness of breath.  She has had a mildly productive cough during this amount of time.  She denies any sore throat. Her stool last night was loose.  Home Medications Prior to Admission medications   Medication Sig Start Date End Date Taking? Authorizing Provider  ondansetron (ZOFRAN-ODT) 4 MG disintegrating tablet Take 1 tablet (4 mg total) by mouth every 8 (eight) hours as needed. 12/12/22  Yes Ernie Avena, MD  amLODipine (NORVASC) 10 MG tablet Take by mouth. 09/19/18   [provider]  COSENTYX SENSOREADY, 300 MG, 150 MG/ML SOAJ Inject 2 Syringes into the skin every 28 (twenty-eight) days. 05/22/19   [provider]  fluticasone (FLONASE) 50 MCG/ACT nasal spray PLACE 2 SPRAYS INTO BOTH NOSTRILS DAILY. 08/14/20 08/14/21  Couture, Cortni S, PA-C  hydrOXYzine (ATARAX/VISTARIL) 25 MG tablet TAKE 1 TABLET BY MOUTH EVERY 8 HOURS AS NEEDED FOR UP TO 10 DAYS 09/19/18   [provider]  metroNIDAZOLE (FLAGYL) 500 MG tablet Take 1 tablet (500 mg total) by mouth 2 (two) times daily. One po bid x 7 days 10/01/18   Rolan Bucco, MD  naproxen (NAPROSYN) 500 MG tablet Take 1  tablet (500 mg total) by mouth 2 (two) times daily. 05/22/19   Petrucelli, Samantha R, PA-C  triamcinolone cream (KENALOG) 0.1 % Apply 1 application topically 2 (two) times daily. 12/08/16   Lavera Guise, MD  triamcinolone ointment (KENALOG) 0.1 % SMARTSIG:Topical 1 to 3 Times Daily PRN 01/20/20   [provider]      Allergies    Penicillin g    Review of Systems   Review of Systems  Constitutional:  Positive for chills and fatigue.  Respiratory:  Positive for cough.   Gastrointestinal:  Positive for abdominal pain, diarrhea, nausea and vomiting.  Musculoskeletal:  Positive for myalgias.  Neurological:  Positive for light-headedness.  All other systems reviewed and are negative.   Physical Exam Updated Vital Signs BP (!) 152/70   Pulse 70   Temp 98.2 F (36.8 C)   Resp 18   Ht 5' 6.5" (1.689 m)   Wt 79.4 kg   LMP 11/28/2022   SpO2 100%   BMI 27.82 kg/m  Physical Exam Vitals and nursing note reviewed.  Constitutional:      General: She is not in acute distress.    Appearance: She is well-developed. She is not ill-appearing.  HENT:     Head: Normocephalic and atraumatic.     Mouth/Throat:     Mouth: Mucous membranes are dry.     Pharynx: No oropharyngeal exudate or posterior oropharyngeal erythema.  Eyes:     Conjunctiva/sclera: Conjunctivae normal.  Neck:  Comments: ROM intact, no meningismus Cardiovascular:     Rate and Rhythm: Normal rate and regular rhythm.  Pulmonary:     Effort: Pulmonary effort is normal. No respiratory distress.     Breath sounds: Normal breath sounds.  Abdominal:     Palpations: Abdomen is soft.     Tenderness: There is abdominal tenderness in the right upper quadrant. There is no guarding or rebound.  Musculoskeletal:        General: No swelling.     Cervical back: Neck supple. No rigidity.     Right lower leg: No edema.     Left lower leg: No edema.  Skin:    General: Skin is warm and dry.     Capillary Refill: Capillary  refill takes less than 2 seconds.  Neurological:     General: No focal deficit present.     Mental Status: She is alert and oriented to person, place, and time. Mental status is at baseline.     Cranial Nerves: No cranial nerve deficit.     Sensory: No sensory deficit.     Motor: No weakness.  Psychiatric:        Mood and Affect: Mood normal.     ED Results / Procedures / Treatments   Labs (all labs ordered are listed, but only abnormal results are displayed) Labs Reviewed  COMPREHENSIVE METABOLIC PANEL - Abnormal; Notable for the following components:      Result Value   Potassium 3.4 (*)    Glucose, Bld 111 (*)    Calcium 8.7 (*)    All other components within normal limits  RESP PANEL BY RT-PCR (RSV, FLU A&B, COVID)  [id number]  CBC WITH DIFFERENTIAL/PLATELET  LIPASE, BLOOD  HCG, QUANTITATIVE, PREGNANCY  D-DIMER, QUANTITATIVE  TROPONIN I (HIGH SENSITIVITY)    EKG EKG Interpretation  Date/Time:  Monday Dec 12 2022 15:36:41 EDT Ventricular Rate:  61 PR Interval:  151 QRS Duration: 91 QT Interval:  437 QTC Calculation: 441 R Axis:   88 Text Interpretation: Sinus rhythm Consider left ventricular hypertrophy Confirmed by Ernie Avena ([id number]) on 12/12/2022 3:41:10 PM  Radiology US Abdomen Limited RUQ (LIVER/GB)  Result Date: 12/12/2022 CLINICAL DATA:  Right upper quadrant pain. Nausea and vomiting. Patient has not been NPO EXAM: ULTRASOUND ABDOMEN LIMITED RIGHT UPPER QUADRANT COMPARISON:  None Available. FINDINGS: Gallbladder: No gallstones or wall thickening visualized. No sonographic Murphy sign noted by sonographer. Common bile duct: Diameter: 6 mm, within normal limits. Liver: No focal lesion identified. Within normal limits in parenchymal echogenicity. Portal vein is patent on color Doppler imaging with normal direction of blood flow towards the liver. Other: None. IMPRESSION: Negative. No hepatobiliary abnormality identified. Electronically Signed   By: Danae Orleans M.D.    On: 12/12/2022 17:11   DG Chest Portable 1 View  Result Date: 12/12/2022 CLINICAL DATA:  Cough, lightheadedness and near syncope. EXAM: PORTABLE CHEST 1 VIEW COMPARISON:  April 02, 2020 FINDINGS: Cardiomediastinal silhouette is enlarged. Mediastinal contours appear intact. There is no evidence of focal airspace consolidation, pleural effusion or pneumothorax. Interstitial pulmonary edema. Osseous structures are without acute abnormality. Soft tissues are grossly normal. IMPRESSION: 1. Enlarged cardiomediastinal silhouette. 2. Interstitial pulmonary edema. Electronically Signed   By: Ted Mcalpine M.D.   On: 12/12/2022 16:53    Procedures Procedures    Medications Ordered in ED Medications  potassium chloride SA (KLOR-CON M) CR tablet 20 mEq (has no administration in time range)  lactated ringers bolus 1,000 mL (1,000 mLs Intravenous  New Bag/Given 12/12/22 1618)  acetaminophen (TYLENOL) tablet 1,000 mg (1,000 mg Oral Given 12/12/22 1555)    ED Course/ Medical Decision Making/ A&P                             Medical Decision Making Amount and/or Complexity of Data Reviewed Labs: ordered. Radiology: ordered.  Risk OTC drugs. Prescription drug management.    47 year old female presenting to the emergency department with malaise.  The patient states that she has had symptoms since this past Saturday.  She endorses generalized malaise, body aches, weakness, chills, additionally she had nausea and vomiting and loose stools yesterday.  She had NBNB emesis twice last night.  She feels lightheaded appetite decreased oral intake.  She denies any numbness, weakness focally, facial droop, dysarthria or dysphagia.  She is tolerating oral intake.  She denies any chest pain or shortness of breath.  She has had a mildly productive cough during this amount of time.  She denies any sore throat. Her stool last night was loose.  On arrival, the patient was afebrile, hemodynamically stable, not  tachycardic or tachypneic, BP 136/87, saturating 100% on room air.  Sinus rhythm noted on cardiac telemetry.  Physical exam significant for a well-appearing patient with dry mucous membranes, no significant oropharyngeal erythema or discharge, negative meningismus, lungs CTAB, abdominal exam significant for right upper quadrant tenderness no with no rebound or guarding.  Differential diagnosis includes most likely gastroenteritis/viral syndrome, less likely but considered cholelithiasis/cholecystitis, pancreatitis, small bowel obstruction, diverticulitis, appendicitis.  Abdomen is overall soft and nontender with the exception of mild right upper quadrant tenderness therefore feel that other acute intra-abdominal abnormalities are less likely.  Patient feeling lightheaded, suspect orthostatic near syncope in the setting of dehydration and hypovolemia due to decreased oral intake.  Mild cough, suspect likely viral URI versus developing pneumonia, less likely PE.   EKG revealed sinus rhythm, ventricular rate 61, no acute ischemic changes noted.  A chest x-ray was performed which was unremarkable.  A right upper quadrant ultrasound was also unremarkable.  Laboratory evaluation significant for mild hypokalemia to 3.4, otherwise generally unremarkable.  Troponin negative, D-dimer negative, lipase normal, CBC unremarkable, COVID and influenza negative.  Patient administered Tylenol for muscle aches, fluid resuscitated with an LR bolus.  Her potassium was replenished orally.  Her symptoms are consistent with likely viral gastroenteritis.  On repeat assessment, she was tolerating oral intake, overall well-appearing, overall return precautions were provided, stable for discharge.   Final Clinical Impression(s) / ED Diagnoses Final diagnoses:  Viral syndrome  Lightheadedness  Gastroenteritis    Rx / DC Orders ED Discharge Orders          Ordered    ondansetron (ZOFRAN-ODT) 4 MG disintegrating tablet  Every 8  hours PRN        12/12/22 1719              Ernie Avena, MD 12/12/22 1721

## 2023-09-24 ENCOUNTER — Emergency Department (HOSPITAL_BASED_OUTPATIENT_CLINIC_OR_DEPARTMENT_OTHER)
Admission: EM | Admit: 2023-09-24 | Discharge: 2023-09-24 | Disposition: A | Discharge status: Home / Self Care | Attending: Emergency Medicine | Admitting: Emergency Medicine

## 2023-09-24 ENCOUNTER — Encounter (HOSPITAL_BASED_OUTPATIENT_CLINIC_OR_DEPARTMENT_OTHER): Payer: Self-pay | Admitting: Urology

## 2023-09-24 ENCOUNTER — Emergency Department (HOSPITAL_BASED_OUTPATIENT_CLINIC_OR_DEPARTMENT_OTHER)

## 2023-09-24 ENCOUNTER — Other Ambulatory Visit: Payer: Self-pay

## 2023-09-24 DIAGNOSIS — M25561 Pain in right knee: Secondary | ICD-10-CM | POA: Diagnosis present

## 2023-09-24 DIAGNOSIS — I10 Essential (primary) hypertension: Secondary | ICD-10-CM | POA: Insufficient documentation

## 2023-09-24 DIAGNOSIS — Z79899 Other long term (current) drug therapy: Secondary | ICD-10-CM | POA: Insufficient documentation

## 2023-09-24 DIAGNOSIS — M79651 Pain in right thigh: Secondary | ICD-10-CM | POA: Insufficient documentation

## 2023-09-24 MED ORDER — METHYLPREDNISOLONE 4 MG PO TBPK: ORAL_TABLET | ORAL | 0 refills | Status: AC

## 2023-09-24 NOTE — ED Triage Notes (Signed)
 Pt ambulatory to triage   States right knee pain since Tuesday  States felt like cramp in thigh and radiated to right knee  Pain with bending and weight bearing   On feet a lot for work

## 2023-09-24 NOTE — Discharge Instructions (Addendum)
 You were seen in the emergency department today for knee pain.  As we discussed your x-ray and ultrasound looked fairly normal.  I suspect that your symptoms are likely coming from inflammation within the joint.  I recommend continuing ibuprofen as needed for pain, and writing you a course of steroids.  I recommend using the knee brace while you are at work.  If your symptoms are no better in a week, I recommend following up with the orthopedist, and I have attached their contact information for you to call and request an ER follow-up appointment.

## 2023-09-24 NOTE — ED Notes (Signed)
 Discharge paperwork reviewed entirely with patient, including follow up care. Pain was under control. No prescriptions were called in, but all questions were addressed.  Pt verbalized understanding as well as all parties involved. No questions or concerns voiced at the time of discharge. No acute distress noted.   Pt ambulated out to PVA without incident or assistance.  Pt advised they will seek followup care with a specialist and followup with their PCP.   The pt was instructed to set up and/or review MyChart for their results; and was informed their Providers all have access to the information as well.

## 2023-09-24 NOTE — ED Provider Notes (Signed)
 [place] [facility] Provider Note   CSN: [id number] Arrival date & time: 09/24/23  1524     History  Chief Complaint  Patient presents with   Knee Pain    Christina Mayer is a 48 y.o. female with hx of HTN and eczema who presents to the ER complaining of R knee pain x 6 days. Initially had a cramp in the right thigh and it started radiating to the R knee. Pain is worse with bending the knee and weight bearing. States she is frequently on her feet for work, and she is not used to this much walking while at work.  Denies any known injury to the knee or leg.  She denies any history of blood clots.  States that she has had sciatica on that side before, but this pain feels different.   Knee Pain      Home Medications Prior to Admission medications   Medication Sig Start Date End Date Taking? Authorizing Provider  methylPREDNISolone (MEDROL DOSEPAK) 4 MG TBPK tablet Take per package instructions 09/24/23  Yes Lorrie Gargan T, PA-C  amLODipine (NORVASC) 10 MG tablet Take by mouth. 09/19/18   [provider]  COSENTYX SENSOREADY, 300 MG, 150 MG/ML SOAJ Inject 2 Syringes into the skin every 28 (twenty-eight) days. 05/22/19   [provider]  fluticasone (FLONASE) 50 MCG/ACT nasal spray PLACE 2 SPRAYS INTO BOTH NOSTRILS DAILY. 08/14/20 08/14/21  Couture, Cortni S, PA-C  hydrOXYzine (ATARAX/VISTARIL) 25 MG tablet TAKE 1 TABLET BY MOUTH EVERY 8 HOURS AS NEEDED FOR UP TO 10 DAYS 09/19/18   [provider]  metroNIDAZOLE (FLAGYL) 500 MG tablet Take 1 tablet (500 mg total) by mouth 2 (two) times daily. One po bid x 7 days 10/01/18   Rolan Bucco, MD  naproxen (NAPROSYN) 500 MG tablet Take 1 tablet (500 mg total) by mouth 2 (two) times daily. 05/22/19   Petrucelli, Samantha R, PA-C  ondansetron (ZOFRAN-ODT) 4 MG disintegrating tablet Take 1 tablet (4 mg total) by mouth every 8 (eight) hours as needed. 12/12/22   Ernie Avena, MD   triamcinolone cream (KENALOG) 0.1 % Apply 1 application topically 2 (two) times daily. 12/08/16   Lavera Guise, MD  triamcinolone ointment (KENALOG) 0.1 % SMARTSIG:Topical 1 to 3 Times Daily PRN 01/20/20   [provider]      Allergies    Penicillin g    Review of Systems   Review of Systems  Cardiovascular:  Negative for leg swelling.  Musculoskeletal:  Positive for arthralgias and myalgias. Negative for joint swelling.  All other systems reviewed and are negative.   Physical Exam Updated Vital Signs BP (!) 159/84 (BP Location: Left Arm)   Pulse (!) 52   Temp 97.7 F (36.5 C) (Oral)   Resp 18   Ht 5\' 7"  (1.702 m)   Wt 79.4 kg   LMP 09/16/2023   SpO2 99%   BMI 27.42 kg/m  Physical Exam Vitals and nursing note reviewed.  Constitutional:      Appearance: Normal appearance.  HENT:     Head: Normocephalic and atraumatic.  Eyes:     Conjunctiva/sclera: Conjunctivae normal.  Cardiovascular:     Pulses:          Posterior tibial pulses are 1+ on the right side and 1+ on the left side.  Pulmonary:     Effort: Pulmonary effort is normal. No respiratory distress.  Musculoskeletal:     Comments: Right knee without appreciable  effusion, compartments of the leg are soft, pain with knee flexion, especially at extreme degree of range  Skin:    General: Skin is warm and dry.  Neurological:     Mental Status: She is alert.  Psychiatric:        Mood and Affect: Mood normal.        Behavior: Behavior normal.     ED Results / Procedures / Treatments   Labs (all labs ordered are listed, but only abnormal results are displayed) Labs Reviewed - No data to display  EKG None  Radiology US Venous Img Lower Right (DVT Study) Result Date: 09/24/2023 CLINICAL DATA:  Right thigh pain for 1 week. EXAM: Right LOWER EXTREMITY VENOUS DOPPLER ULTRASOUND TECHNIQUE: Gray-scale sonography with compression, as well as color and duplex ultrasound, were performed to evaluate the deep  venous system(s) from the level of the common femoral vein through the popliteal and proximal calf veins. COMPARISON:  None Available. FINDINGS: VENOUS Normal compressibility of the common femoral, superficial femoral, and popliteal veins, as well as the visualized calf veins. Visualized portions of profunda femoral vein and great saphenous vein unremarkable. No filling defects to suggest DVT on grayscale or color Doppler imaging. Doppler waveforms show normal direction of venous flow, normal respiratory plasticity and response to augmentation. Limited views of the contralateral common femoral vein are unremarkable. OTHER None. Limitations: none IMPRESSION: Negative. Electronically Signed   By: Lupita Raider M.D.   On: 09/24/2023 17:51   DG Knee Complete 4 Views Right Result Date: 09/24/2023 CLINICAL DATA:  knee pain EXAM: RIGHT KNEE - COMPLETE 4+ VIEW COMPARISON:  May 22, 2019 FINDINGS: No acute fracture or dislocation. Joint spaces and alignment are maintained. 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 No area of erosion or osseous destruction. No unexpected radiopaque foreign body. Soft tissues are unremarkable. IMPRESSION: 1. No acute fracture or dislocation. Electronically Signed   By: Meda Klinefelter M.D.   On: 09/24/2023 16:25    Procedures Procedures    Medications Ordered in ED Medications - No data to display  ED Course/ Medical Decision Making/ A&P                                 Medical Decision Making Amount and/or Complexity of Data Reviewed Radiology: ordered.   This patient is a 48 y.o. female  who presents to the ED for concern of R leg/knee pain x 5 days.   Differential diagnoses prior to evaluation: The emergent differential diagnosis includes, but is not limited to, fracture, dislocation, ligamentous injury, muscle strain, DVT, septic joint, arterial occlusion. This is not an  exhaustive differential.   Past Medical History / Co-morbidities / Social History: Eczema, hypertension  Additional history: Chart reviewed. Pertinent results include: Reviewed prior right knee x-ray compared to today  Physical Exam: Physical exam performed. 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 I agree with the radiologist interpretation.  Disposition: After consideration of the diagnostic results and the patients response to treatment, I feel that emergency department workup does  not suggest an emergent condition requiring admission or immediate intervention beyond what has been performed at this time. The plan is: Discharge to home.  No emergent etiology found for symptoms today.  Will provide with knee immobilizer, Medrol Dosepak, and recommend follow-up with orthopedist. Provided with follow up info. The patient is safe for discharge and has been instructed to return immediately for worsening symptoms, change in symptoms or any other concerns.  Final Clinical Impression(s) / ED Diagnoses Final diagnoses:  Acute pain of right knee    Rx / DC Orders ED Discharge Orders          Ordered    methylPREDNISolone (MEDROL DOSEPAK) 4 MG TBPK tablet        09/24/23 1807           Portions of this report may have been transcribed using voice recognition software. Every effort was made to ensure accuracy; however, inadvertent computerized transcription errors may be present.    Su Monks, PA-C 09/24/23 1816    Rozelle Logan, DO 09/24/23 2104

## 2023-12-09 ENCOUNTER — Encounter (HOSPITAL_BASED_OUTPATIENT_CLINIC_OR_DEPARTMENT_OTHER): Payer: Self-pay

## 2023-12-09 ENCOUNTER — Emergency Department (HOSPITAL_BASED_OUTPATIENT_CLINIC_OR_DEPARTMENT_OTHER)
Admission: EM | Admit: 2023-12-09 | Discharge: 2023-12-09 | Disposition: A | Discharge status: Home / Self Care | Attending: Emergency Medicine | Admitting: Emergency Medicine

## 2023-12-09 ENCOUNTER — Emergency Department (HOSPITAL_BASED_OUTPATIENT_CLINIC_OR_DEPARTMENT_OTHER)

## 2023-12-09 ENCOUNTER — Other Ambulatory Visit: Payer: Self-pay

## 2023-12-09 DIAGNOSIS — R1030 Lower abdominal pain, unspecified: Secondary | ICD-10-CM | POA: Diagnosis present

## 2023-12-09 DIAGNOSIS — Z79899 Other long term (current) drug therapy: Secondary | ICD-10-CM | POA: Diagnosis not present

## 2023-12-09 DIAGNOSIS — Z760 Encounter for issue of repeat prescription: Secondary | ICD-10-CM | POA: Diagnosis not present

## 2023-12-09 DIAGNOSIS — I10 Essential (primary) hypertension: Secondary | ICD-10-CM | POA: Diagnosis not present

## 2023-12-09 DIAGNOSIS — R102 Pelvic and perineal pain unspecified side: Secondary | ICD-10-CM

## 2023-12-09 LAB — CBC WITH DIFFERENTIAL/PLATELET
Abs Immature Granulocytes: 0.01 10*3/uL (ref 0.00–0.07)
Basophils Absolute: 0.1 10*3/uL (ref 0.0–0.1)
Basophils Relative: 1 %
Eosinophils Absolute: 0.2 10*3/uL (ref 0.0–0.5)
Eosinophils Relative: 3 %
HCT: 41.1 % (ref 36.0–46.0)
Hemoglobin: 14.4 g/dL (ref 12.0–15.0)
Immature Granulocytes: 0 %
Lymphocytes Relative: 32 %
Lymphs Abs: 2.2 10*3/uL (ref 0.7–4.0)
MCH: 29.3 pg (ref 26.0–34.0)
MCHC: 35 g/dL (ref 30.0–36.0)
MCV: 83.5 fL (ref 80.0–100.0)
Monocytes Absolute: 0.5 10*3/uL (ref 0.1–1.0)
Monocytes Relative: 7 %
Neutro Abs: 4 10*3/uL (ref 1.7–7.7)
Neutrophils Relative %: 57 %
Platelets: 304 10*3/uL (ref 150–400)
RBC: 4.92 MIL/uL (ref 3.87–5.11)
RDW: 12.8 % (ref 11.5–15.5)
WBC: 7 10*3/uL (ref 4.0–10.5)
nRBC: 0 % (ref 0.0–0.2)

## 2023-12-09 LAB — URINALYSIS, ROUTINE W REFLEX MICROSCOPIC
Bilirubin Urine: NEGATIVE
Glucose, UA: NEGATIVE mg/dL
Ketones, ur: NEGATIVE mg/dL
Leukocytes,Ua: NEGATIVE
Nitrite: NEGATIVE
Protein, ur: NEGATIVE mg/dL
Specific Gravity, Urine: 1.015 (ref 1.005–1.030)
pH: 6.5 (ref 5.0–8.0)

## 2023-12-09 LAB — COMPREHENSIVE METABOLIC PANEL WITH GFR
ALT: 12 U/L (ref 0–44)
AST: 28 U/L (ref 15–41)
Albumin: 4.8 g/dL (ref 3.5–5.0)
Alkaline Phosphatase: 57 U/L (ref 38–126)
Anion gap: 12 (ref 5–15)
BUN: 13 mg/dL (ref 6–20)
CO2: 27 mmol/L (ref 22–32)
Calcium: 9.5 mg/dL (ref 8.9–10.3)
Chloride: 100 mmol/L (ref 98–111)
Creatinine, Ser: 0.86 mg/dL (ref 0.44–1.00)
GFR, Estimated: >60 mL/min (ref >60)
Glucose, Bld: 78 mg/dL (ref 70–99)
Potassium: 3.5 mmol/L (ref 3.5–5.1)
Sodium: 139 mmol/L (ref 135–145)
Total Bilirubin: 0.6 mg/dL (ref 0.0–1.2)
Total Protein: 8.1 g/dL (ref 6.5–8.1)

## 2023-12-09 LAB — WET PREP, GENITAL
Clue Cells Wet Prep HPF POC: NONE SEEN
Sperm: NONE SEEN
Trich, Wet Prep: NONE SEEN
WBC, Wet Prep HPF POC: <10 (ref <10)
Yeast Wet Prep HPF POC: NONE SEEN

## 2023-12-09 LAB — HIV ANTIBODY (ROUTINE TESTING W REFLEX): HIV Screen 4th Generation wRfx: NONREACTIVE

## 2023-12-09 LAB — URINALYSIS, MICROSCOPIC (REFLEX)

## 2023-12-09 LAB — PREGNANCY, URINE: Preg Test, Ur: NEGATIVE

## 2023-12-09 MED ORDER — AMLODIPINE BESYLATE 10 MG PO TABS: 10.0000 mg | ORAL_TABLET | Freq: Every day | ORAL | 1 refills | Status: AC

## 2023-12-09 MED ORDER — IBUPROFEN 600 MG PO TABS
600.0000 mg | ORAL_TABLET | Freq: Four times a day (QID) | ORAL | 0 refills | Status: AC | PRN
Start: 2023-12-09 — End: ?

## 2023-12-09 NOTE — ED Triage Notes (Signed)
 Reports lower abd/pelvic pain for 2 days. States "it feels like something is stuck in vagina." States she "stuck hand in vagina and saw blood but blood is not leaking out"   Denies urinary symptoms, NVD, fevers

## 2023-12-09 NOTE — ED Provider Notes (Signed)
 [place] [facility] Provider Note   CSN: [id number] Arrival date & time: 12/09/23  2440     History  Chief Complaint  Patient presents with   Vaginal Bleeding    Christina Mayer is a 48 y.o. female.  Pt is a 48 yo female with pmhx significant for htn and psoriasis.  Pt presents to the ED today with vaginal discomfort and bleeding.  She said it feels like something is stuck in her vagina, but she does not know what it could be.  She does not use tampons or condoms.  She said her partner's penis had blood on it after having sex.  She also put her hand inside her vagina to try to see if anything was there and it had blood on it when she withdrew her hand.  She is not bleeding like a period.  No dysuria.  She does have some lower abd pain.  No fevers.  LMP 4/30       Home Medications Prior to Admission medications   Medication Sig Start Date End Date Taking? Authorizing Provider  ibuprofen  (ADVIL ) 600 MG tablet Take 1 tablet (600 mg total) by mouth every 6 (six) hours as needed. 12/09/23  Yes [place], MD  amLODipine (NORVASC) 10 MG tablet Take 1 tablet (10 mg total) by mouth daily. 12/09/23   Sueellen Emery, MD  COSENTYX SENSOREADY, 300 MG, 150 MG/ML SOAJ Inject 2 Syringes into the skin every 28 (twenty-eight) days. 05/22/19   [provider]  fluticasone  (FLONASE ) 50 MCG/ACT nasal spray PLACE 2 SPRAYS INTO BOTH NOSTRILS DAILY. 08/14/20 08/14/21  Couture, Cortni S, PA-C  hydrOXYzine (ATARAX/VISTARIL) 25 MG tablet TAKE 1 TABLET BY MOUTH EVERY 8 HOURS AS NEEDED FOR UP TO 10 DAYS 09/19/18   [provider]  methylPREDNISolone  (MEDROL  DOSEPAK) 4 MG TBPK tablet Take per package instructions 09/24/23   Roemhildt, Lorin T, PA-C  metroNIDAZOLE  (FLAGYL ) 500 MG tablet Take 1 tablet (500 mg total) by mouth 2 (two) times daily. One po bid x 7 days 10/01/18   Hershel Los, MD  naproxen  (NAPROSYN ) 500 MG tablet Take 1 tablet (500 mg total) by  mouth 2 (two) times daily. 05/22/19   Petrucelli, Samantha R, PA-C  ondansetron  (ZOFRAN -ODT) 4 MG disintegrating tablet Take 1 tablet (4 mg total) by mouth every 8 (eight) hours as needed. 12/12/22   Rosealee Concha, MD  triamcinolone  cream (KENALOG ) 0.1 % Apply 1 application topically 2 (two) times daily. 12/08/16   Raymundo Calk, MD  triamcinolone  ointment (KENALOG ) 0.1 % SMARTSIG:Topical 1 to 3 Times Daily PRN 01/20/20   [provider]      Allergies    Penicillin  g and Wound dressing adhesive    Review of Systems   Review of Systems  Gastrointestinal:  Positive for abdominal pain.  Genitourinary:  Positive for pelvic pain.  All other systems reviewed and are negative.   Physical Exam Updated Vital Signs BP (!) 140/77   Pulse (!) 58   Temp 97.6 F (36.4 C) (Oral)   Resp 17   Ht 5\' 6"  (1.676 m)   Wt 68 kg   LMP 11/15/2023 (Approximate)   SpO2 100%   BMI 24.21 kg/m  Physical Exam Vitals and nursing note reviewed. Exam conducted with a chaperone present.  Constitutional:      Appearance: Normal appearance.  HENT:     Head: Normocephalic and atraumatic.     Right Ear: External ear normal.  Left Ear: External ear normal.     Nose: Nose normal.     Mouth/Throat:     Mouth: Mucous membranes are moist.     Pharynx: Oropharynx is clear.  Eyes:     Extraocular Movements: Extraocular movements intact.     Conjunctiva/sclera: Conjunctivae normal.     Pupils: Pupils are equal, round, and reactive to light.  Cardiovascular:     Rate and Rhythm: Normal rate and regular rhythm.     Pulses: Normal pulses.     Heart sounds: Normal heart sounds.  Pulmonary:     Effort: Pulmonary effort is normal.     Breath sounds: Normal breath sounds.  Abdominal:     General: Abdomen is flat. Bowel sounds are normal.     Palpations: Abdomen is soft.  Genitourinary:    Exam position: Lithotomy position.     Vagina: Normal.     Cervix: Friability present.     Uterus: Normal.       Adnexa: Right adnexa normal.       Left: Tenderness and fullness present.   Musculoskeletal:        General: Normal range of motion.     Cervical back: Normal range of motion and neck supple.  Skin:    General: Skin is warm.     Capillary Refill: Capillary refill takes less than 2 seconds.  Neurological:     General: No focal deficit present.     Mental Status: She is alert and oriented to person, place, and time.  Psychiatric:        Mood and Affect: Mood normal.        Behavior: Behavior normal.     ED Results / Procedures / Treatments   Labs (all labs ordered are listed, but only abnormal results are displayed) Labs Reviewed  URINALYSIS, ROUTINE W REFLEX MICROSCOPIC - Abnormal; Notable for the following components:      Result Value   Hgb urine dipstick SMALL (*)    All other components within normal limits  URINALYSIS, MICROSCOPIC (REFLEX) - Abnormal; Notable for the following components:   Bacteria, UA RARE (*)    All other components within normal limits  WET PREP, GENITAL  CBC WITH DIFFERENTIAL/PLATELET  COMPREHENSIVE METABOLIC PANEL WITH GFR  PREGNANCY, URINE  HIV ANTIBODY (ROUTINE TESTING W REFLEX)  GC/CHLAMYDIA PROBE AMP ([place]) NOT AT [facility]    EKG None  Radiology US  PELVIC COMPLETE W TRANSVAGINAL AND TORSION R/O Result Date: 12/09/2023 CLINICAL DATA:  Left lower quadrant pain for 2 days. Spotting after intercourse. EXAM: TRANSABDOMINAL AND TRANSVAGINAL ULTRASOUND OF PELVIS DOPPLER ULTRASOUND OF OVARIES TECHNIQUE: Both transabdominal and transvaginal ultrasound examinations of the pelvis were performed. Transabdominal technique was performed for global imaging of the pelvis including uterus, ovaries, adnexal regions, and pelvic cul-de-sac. It was necessary to proceed with endovaginal exam following the transabdominal exam to visualize the uterus, endometrium and ovaries. Color and duplex Doppler ultrasound was utilized to evaluate blood flow to the ovaries.  COMPARISON:  08/27/2021 FINDINGS: Uterus Measurements: 7.8 x 3.6 x 4.9 cm = volume: 72 mL. Small cyst within the anterior myometrium measuring 3 mm. Endometrium Thickness: 1.9 mm.  No focal abnormality visualized. Right ovary Measurements: 2.7 x 1.9 x 2.0 cm = volume: 5.3 mL. Focal hypoechoic structure containing low level internal echoes measures 1.4 cm. Left ovary Measurements: 3.4 x 1.3 x 2.0 cm = volume: 4.4 mL. Normal appearance/no adnexal mass. Pulsed Doppler evaluation of both ovaries demonstrates normal low-resistance arterial and  venous waveforms. Other findings No abnormal free fluid. IMPRESSION: 1. No acute findings. 2. No evidence for ovarian torsion. 3. 1.4 cm hypoechoic structure within the right ovary containing low level internal echoes. This may represent a hemorrhagic cyst. Consider follow-up pelvic ultrasound in 6-12 weeks to assess for resolution. Electronically Signed   By: Kimberley Penman M.D.   On: 12/09/2023 10:08    Procedures Procedures    Medications Ordered in ED Medications - No data to display  ED Course/ Medical Decision Making/ A&P                                 Medical Decision Making Amount and/or Complexity of Data Reviewed Labs: ordered. Radiology: ordered.  Risk Prescription drug management.   This patient presents to the ED for concern of vaginal discomfort, this involves an extensive number of treatment options, and is a complaint that carries with it a high risk of complications and morbidity.  The differential diagnosis includes pregnancy, infection, torsion   Co morbidities that complicate the patient evaluation  htn and psoriasis   Additional history obtained:  Additional history obtained from epic chart review  Lab Tests:  I Ordered, and personally interpreted labs.  The pertinent results include:  cbc nl, wet prep neg; ua neg, cmp nl   Imaging Studies ordered:  I ordered imaging studies including US   I independently visualized  and interpreted imaging which showed  No acute findings.  2. No evidence for ovarian torsion.  3. 1.4 cm hypoechoic structure within the right ovary containing low  level internal echoes. This may represent a hemorrhagic cyst.  Consider follow-up pelvic ultrasound in 6-12 weeks to assess for  resolution.   I agree with the radiologist interpretation  Medicines ordered and prescription drug management:  I have reviewed the patients home medicines and have made adjustments as needed   Test Considered:  US    Critical Interventions:  us    Problem List / ED Course:  Pelvic pain with spotting:   unclear etiology.  Pt does have a possible cyst on right ovary, but pain is on the left.  Pt's labs are nl.  She is stable for d/c.  She is to f/u with pcp/ob.   Med Refill:  pt asks for a refill of her amlodipine.  This is given to pt.  F/u with pcp.   Reevaluation:  After the interventions noted above, I reevaluated the patient and found that they have :improved   Social Determinants of Health:  Lives at home   Dispostion:  After consideration of the diagnostic results and the patients response to treatment, I feel that the patent would benefit from discharge with outpatient f/u.          Final Clinical Impression(s) / ED Diagnoses Final diagnoses:  Pelvic pain  Medication refill    Rx / DC Orders ED Discharge Orders          Ordered    amLODipine (NORVASC) 10 MG tablet  Daily        12/09/23 1014    ibuprofen  (ADVIL ) 600 MG tablet  Every 6 hours PRN        12/09/23 1014              Marelly Wehrman, MD 12/09/23 1027

## 2023-12-12 LAB — GC/CHLAMYDIA PROBE AMP (~~LOC~~) NOT AT ARMC
Chlamydia: NEGATIVE
Comment: NEGATIVE
Comment: NORMAL
Neisseria Gonorrhea: NEGATIVE

## 2024-04-15 ENCOUNTER — Other Ambulatory Visit (HOSPITAL_COMMUNITY): Payer: Self-pay

## 2024-06-20 ENCOUNTER — Emergency Department (HOSPITAL_BASED_OUTPATIENT_CLINIC_OR_DEPARTMENT_OTHER)
Admission: EM | Admit: 2024-06-20 | Discharge: 2024-06-20 | Disposition: A | Discharge status: Home / Self Care | Attending: Emergency Medicine | Admitting: Emergency Medicine

## 2024-06-20 ENCOUNTER — Encounter (HOSPITAL_BASED_OUTPATIENT_CLINIC_OR_DEPARTMENT_OTHER): Payer: Self-pay | Admitting: Emergency Medicine

## 2024-06-20 ENCOUNTER — Other Ambulatory Visit: Payer: Self-pay

## 2024-06-20 DIAGNOSIS — Z79899 Other long term (current) drug therapy: Secondary | ICD-10-CM | POA: Insufficient documentation

## 2024-06-20 DIAGNOSIS — B349 Viral infection, unspecified: Secondary | ICD-10-CM | POA: Insufficient documentation

## 2024-06-20 DIAGNOSIS — I10 Essential (primary) hypertension: Secondary | ICD-10-CM | POA: Diagnosis not present

## 2024-06-20 DIAGNOSIS — R059 Cough, unspecified: Secondary | ICD-10-CM | POA: Diagnosis present

## 2024-06-20 LAB — URINALYSIS, MICROSCOPIC (REFLEX)

## 2024-06-20 LAB — COMPREHENSIVE METABOLIC PANEL WITH GFR
ALT: 12 U/L (ref 0–44)
AST: 30 U/L (ref 15–41)
Albumin: 4.1 g/dL (ref 3.5–5.0)
Alkaline Phosphatase: 56 U/L (ref 38–126)
Anion gap: 11 (ref 5–15)
BUN: 14 mg/dL (ref 6–20)
CO2: 23 mmol/L (ref 22–32)
Calcium: 9.3 mg/dL (ref 8.9–10.3)
Chloride: 103 mmol/L (ref 98–111)
Creatinine, Ser: 1.14 mg/dL — ABNORMAL HIGH (ref 0.44–1.00)
GFR, Estimated: 60 mL/min — ABNORMAL LOW (ref >60)
Glucose, Bld: 125 mg/dL — ABNORMAL HIGH (ref 70–99)
Potassium: 3.8 mmol/L (ref 3.5–5.1)
Sodium: 137 mmol/L (ref 135–145)
Total Bilirubin: 0.9 mg/dL (ref 0.0–1.2)
Total Protein: 7.3 g/dL (ref 6.5–8.1)

## 2024-06-20 LAB — RESP PANEL BY RT-PCR (RSV, FLU A&B, COVID)  RVPGX2
Influenza A by PCR: NEGATIVE
Influenza B by PCR: NEGATIVE
Resp Syncytial Virus by PCR: NEGATIVE
SARS Coronavirus 2 by RT PCR: NEGATIVE

## 2024-06-20 LAB — LIPASE, BLOOD: Lipase: 35 U/L (ref 11–51)

## 2024-06-20 LAB — URINALYSIS, ROUTINE W REFLEX MICROSCOPIC
Bilirubin Urine: NEGATIVE
Glucose, UA: NEGATIVE mg/dL
Ketones, ur: NEGATIVE mg/dL
Leukocytes,Ua: NEGATIVE
Nitrite: NEGATIVE
Protein, ur: NEGATIVE mg/dL
Specific Gravity, Urine: 1.02 (ref 1.005–1.030)
pH: 5.5 (ref 5.0–8.0)

## 2024-06-20 LAB — CBC
HCT: 40.6 % (ref 36.0–46.0)
Hemoglobin: 14.3 g/dL (ref 12.0–15.0)
MCH: 29.6 pg (ref 26.0–34.0)
MCHC: 35.2 g/dL (ref 30.0–36.0)
MCV: 84.1 fL (ref 80.0–100.0)
Platelets: 358 K/uL (ref 150–400)
RBC: 4.83 MIL/uL (ref 3.87–5.11)
RDW: 13.3 % (ref 11.5–15.5)
WBC: 7.2 K/uL (ref 4.0–10.5)
nRBC: 0 % (ref 0.0–0.2)

## 2024-06-20 LAB — PREGNANCY, URINE: Preg Test, Ur: NEGATIVE

## 2024-06-20 LAB — GROUP A STREP BY PCR: Group A Strep by PCR: NOT DETECTED

## 2024-06-20 MED ORDER — ONDANSETRON 4 MG PO TBDP: 4.0000 mg | ORAL_TABLET | Freq: Three times a day (TID) | ORAL | 0 refills | Status: AC | PRN

## 2024-06-20 NOTE — Discharge Instructions (Addendum)
 Please read and follow all provided instructions.  Your diagnoses today include:  1. Viral syndrome     You appear to have an upper respiratory infection (URI). An upper respiratory tract infection, or cold, is a viral infection of the air passages leading to the lungs. It should improve gradually after 5-7 days. You may have a lingering cough that lasts for 2- 4 weeks after the infection.  Tests performed today include: Vital signs. See below for your results today.  Flu, COVID, RSV testing was negative.  Strep test was negative. Complete blood cell count: Normal blood cell counts Complete metabolic panel: Kidney function was just mildly weak, indicating some dehydration Lipase (pancreas function test): Was normal Urinalysis (urine test): Negative Pregnancy test (urine or blood, in women only): No sign of UTI   Medications prescribed:  Zofran  (ondansetron ) - for nausea and vomiting  Please use over-the-counter NSAID medications (ibuprofen , naproxen ) or Tylenol  (acetaminophen ) as directed on the packaging for pain -- as long as you do not have any reasons avoid these medications. Reasons to avoid NSAID medications include: weak kidneys, a history of bleeding in your stomach or gut, or uncontrolled high blood pressure or previous heart attack. Reasons to avoid Tylenol  include: liver problems or ongoing alcohol use. Never take more than 4000mg  or 8 Extra strength Tylenol  in a 24 hour period.     Take any prescribed medications only as directed. Treatment for your infection is aimed at treating the symptoms. There are no medications, such as antibiotics, that will cure your infection.   Home care instructions:  You can take Tylenol  and/or Ibuprofen  as directed on the packaging for fever reduction and pain relief.    For cough: honey 1/2 to 1 teaspoon (you can dilute the honey in water or another fluid).  You can also use guaifenesin and dextromethorphan for cough. You can use a humidifier  for chest congestion and cough.  If you don't have a humidifier, you can sit in the bathroom with the hot shower running.      For sore throat: try warm salt water gargles, cepacol lozenges, throat spray, warm tea or water with lemon/honey, popsicles or ice, or OTC cold relief medicine for throat discomfort.    For congestion: take a daily anti-histamine like Zyrtec, Claritin, and a oral decongestant, such as pseudoephedrine.  You can also use Flonase  1-2 sprays in each nostril daily.    It is important to stay hydrated: drink plenty of fluids (water, gatorade/powerade/pedialyte, juices, or teas) to keep your throat moisturized and help further relieve irritation/discomfort.   Your illness is contagious and can be spread to others, especially during the first 3 or 4 days. It cannot be cured by antibiotics or other medicines. Take basic precautions such as washing your hands often, covering your mouth when you cough or sneeze, and avoiding public places where you could spread your illness to others.   Please continue drinking plenty of fluids.  Use over-the-counter medicines as needed as directed on packaging for symptom relief.  You may also use ibuprofen  or tylenol  as directed on packaging for pain or fever.  Do not take multiple medicines containing Tylenol  or acetaminophen  to avoid taking too much of this medication.  Follow-up instructions: Please follow-up with your primary care provider in the next 3 days for further evaluation of your symptoms if you are not feeling better.   Return instructions:  Please return to the Emergency Department if you experience worsening symptoms.  RETURN IMMEDIATELY IF  you develop shortness of breath, confusion or altered mental status, a new rash, become dizzy, faint, or poorly responsive, or are unable to be cared for at home. Please return if you have persistent vomiting and cannot keep down fluids or develop a fever that is not controlled by tylenol  or  motrin .   Please return if you have any other emergent concerns.  Additional Information:  Your vital signs today were: BP 128/75   Pulse (!) 40   Temp 98.5 F (36.9 C) (Oral)   Resp 16   Ht 5' 6 (1.676 m)   LMP 05/28/2024 (Approximate)   SpO2 100%   BMI 24.21 kg/m  If your blood pressure (BP) was elevated above 135/85 this visit, please have this repeated by your doctor within one month. --------------

## 2024-06-20 NOTE — ED Notes (Signed)

## 2024-06-20 NOTE — ED Triage Notes (Signed)
 Pt c/o sore throat, emesis, diarrhea x 2 days.   Children in home + for RSV.

## 2024-06-20 NOTE — ED Provider Notes (Signed)
  EMERGENCY DEPARTMENT AT [facility] Provider Note   CSN: [id number] Arrival date & time: 06/20/24  1407     Patient presents with: Emesis, Diarrhea, and Sore Throat   Christina Mayer is a 48 y.o. female.   Patient with hypertension --presents to the emergency department for 3-day history of nasal congestion, runny nose, sore throat, occasional cough, nausea with occasional episodes of vomiting, and some loose stools.  No urinary symptoms.  Reports recent exposure to family members with RSV.  Patient denies fever.  She has been able to hydrate today.       Prior to Admission medications   Medication Sig Start Date End Date Taking? Authorizing Provider  amLODipine  (NORVASC ) 10 MG tablet Take 1 tablet (10 mg total) by mouth daily. 12/09/23   Dean Clarity, MD  COSENTYX SENSOREADY, 300 MG, 150 MG/ML SOAJ Inject 2 Syringes into the skin every 28 (twenty-eight) days. 05/22/19   [provider]  fluticasone  (FLONASE ) 50 MCG/ACT nasal spray PLACE 2 SPRAYS INTO BOTH NOSTRILS DAILY. 08/14/20 08/14/21  Couture, Cortni S, PA-C  hydrOXYzine (ATARAX/VISTARIL) 25 MG tablet TAKE 1 TABLET BY MOUTH EVERY 8 HOURS AS NEEDED FOR UP TO 10 DAYS 09/19/18   [provider]  ibuprofen  (ADVIL ) 600 MG tablet Take 1 tablet (600 mg total) by mouth every 6 (six) hours as needed. 12/09/23   Dean Clarity, MD  methylPREDNISolone  (MEDROL  DOSEPAK) 4 MG TBPK tablet Take per package instructions 09/24/23   Roemhildt, Lorin T, PA-C  metroNIDAZOLE  (FLAGYL ) 500 MG tablet Take 1 tablet (500 mg total) by mouth 2 (two) times daily. One po bid x 7 days 10/01/18   Lenor Hollering, MD  naproxen  (NAPROSYN ) 500 MG tablet Take 1 tablet (500 mg total) by mouth 2 (two) times daily. 05/22/19   Petrucelli, Samantha R, PA-C  ondansetron  (ZOFRAN -ODT) 4 MG disintegrating tablet Take 1 tablet (4 mg total) by mouth every 8 (eight) hours as needed. 12/12/22   Jerrol Agent, MD  triamcinolone  cream (KENALOG ) 0.1 %  Apply 1 application topically 2 (two) times daily. 12/08/16   Arminda Lonell Narrow, MD  triamcinolone  ointment (KENALOG ) 0.1 % SMARTSIG:Topical 1 to 3 Times Daily PRN 01/20/20   [provider]    Allergies: Penicillin  g and Wound dressing adhesive    Review of Systems  Updated Vital Signs BP 131/67 (BP Location: Right Arm)   Pulse (!) 58   Temp 98.2 F (36.8 C) (Oral)   Resp 17   Ht 5' 6 (1.676 m)   LMP 05/28/2024 (Approximate)   SpO2 99%   BMI 24.21 kg/m   Physical Exam Vitals and nursing note reviewed.  Constitutional:      Appearance: She is well-developed.  HENT:     Head: Normocephalic and atraumatic.     Jaw: No trismus.     Right Ear: Tympanic membrane, ear canal and external ear normal.     Left Ear: Tympanic membrane, ear canal and external ear normal.     Nose: Congestion and rhinorrhea present. No mucosal edema.     Mouth/Throat:     Mouth: Mucous membranes are moist. Mucous membranes are not dry. No oral lesions.     Pharynx: Uvula midline. No oropharyngeal exudate, posterior oropharyngeal erythema or uvula swelling.     Tonsils: No tonsillar abscesses.  Eyes:     General:        Right eye: No discharge.        Left eye: No discharge.  Conjunctiva/sclera: Conjunctivae normal.  Cardiovascular:     Rate and Rhythm: Normal rate and regular rhythm.     Heart sounds: Normal heart sounds.  Pulmonary:     Effort: Pulmonary effort is normal. No respiratory distress.     Breath sounds: Normal breath sounds. No wheezing or rales.  Abdominal:     Palpations: Abdomen is soft.     Tenderness: There is no abdominal tenderness.  Musculoskeletal:     Cervical back: Normal range of motion and neck supple.  Lymphadenopathy:     Cervical: No cervical adenopathy.  Skin:    General: Skin is warm and dry.  Neurological:     Mental Status: She is alert.  Psychiatric:        Mood and Affect: Mood normal.     (all labs ordered are listed, but only abnormal results  are displayed) Labs Reviewed  COMPREHENSIVE METABOLIC PANEL WITH GFR - Abnormal; Notable for the following components:      Result Value   Glucose, Bld 125 (*)    Creatinine, Ser 1.14 (*)    GFR, Estimated 60 (*)    All other components within normal limits  URINALYSIS, ROUTINE W REFLEX MICROSCOPIC - Abnormal; Notable for the following components:   APPearance HAZY (*)    Hgb urine dipstick SMALL (*)    All other components within normal limits  URINALYSIS, MICROSCOPIC (REFLEX) - Abnormal; Notable for the following components:   Bacteria, UA FEW (*)    All other components within normal limits  RESP PANEL BY RT-PCR (RSV, FLU A&B, COVID)  [id number]  GROUP A STREP BY PCR  LIPASE, BLOOD  CBC  PREGNANCY, URINE    EKG: None  Radiology: No results found.   Procedures   Medications Ordered in the ED - No data to display  ED Course  Patient seen and examined. History obtained directly from patient. Work-up including labs, imaging, EKG ordered in triage, if performed, were reviewed.    Labs/EKG: Independently reviewed and interpreted.  This included: CBC with normal white blood cell count and hemoglobin; CMP mild elevation in creatinine at 1.14 with a normal BUN, glucose 125 within normal anion gap; lipase is normal; here COVID, flu, RSV, strep is negative.  Imaging: None ordered  Medications/Fluids: None ordered  Most recent vital signs reviewed and are as follows: BP 131/67 (BP Location: Right Arm)   Pulse (!) 58   Temp 98.2 F (36.8 C) (Oral)   Resp 17   Ht 5' 6 (1.676 m)   LMP 05/28/2024 (Approximate)   SpO2 99%   BMI 24.21 kg/m   Initial impression: Likely viral syndrome with respiratory and GI symptoms.  Patient looks well, nontoxic.  Suspect some mild dehydration due to recent vomiting.  Will attempt to orally hydrate.  If she does well with this, anticipate discharge to home with symptom control.  6:36 PM Reassessment performed. Patient appears stable.  She was  able to drink without vomiting.  She was able to eventually urinate.  Labs personally reviewed and interpreted including: UA not clean-catch but no compelling signs of infection.  Reviewed pertinent lab work and imaging with patient at bedside. Questions answered.   Most current vital signs reviewed and are as follows: BP 128/75   Pulse 64   Temp 98.5 F (36.9 C) (Oral)   Resp 16   Ht 5' 6 (1.676 m)   LMP 05/28/2024 (Approximate)   SpO2 100%   BMI 24.21 kg/m   Plan: Discharge to  home.   Prescriptions written for: Zofran   Other home care instructions discussed: OTC meds for symptom control, rest, hydration.  ED return instructions discussed: Return with worsening symptoms, persistent vomiting, trouble breathing or shortness of breath.  Follow-up instructions discussed: Patient encouraged to follow-up with their PCP in 5 days if not improving.                                  Medical Decision Making Amount and/or Complexity of Data Reviewed Labs: ordered.  Risk Prescription drug management.   Patient with symptoms consistent with a viral syndrome.  Recent exposure to children with RSV.  Viral panel testing here today was negative.  Remainder of lab work was reassuring.  Mild elevation in creatinine, patient orally hydrating here and urinating.  Vitals are stable, no fever.  Lung exam normal, no signs of pneumonia. Supportive therapy indicated with return if symptoms worsen.    The patient's vital signs, pertinent lab work and imaging were reviewed and interpreted as discussed in the ED course. Hospitalization was considered for further testing, treatments, or serial exams/observation. However as patient is well-appearing, has a stable exam, and reassuring studies today, I do not feel that they warrant admission at this time. This plan was discussed with the patient who verbalizes agreement and comfort with this plan and seems reliable and able to return to the Emergency  Department with worsening or changing symptoms.        Final diagnoses:  Viral syndrome    ED Discharge Orders          Ordered    ondansetron  (ZOFRAN -ODT) 4 MG disintegrating tablet  Every 8 hours PRN        06/20/24 1832               Desiderio Chew, PA-C 06/20/24 1837    Cottie Donnice PARAS, MD 06/20/24 [phone]

## 2024-06-20 NOTE — ED Notes (Signed)
 Pt attempted to urinate but couldn't go.
